# Patient Record
Sex: Male | Born: 1950 | Race: White | Hispanic: No | Marital: Single | Smoking: Former smoker
Health system: Southern US, Community
[De-identification: ages and names within clinical notes are randomized; demographics above are authoritative.]

## PROBLEM LIST (undated history)

## (undated) DIAGNOSIS — R220 Localized swelling, mass and lump, head: Secondary | ICD-10-CM

## (undated) DIAGNOSIS — R1313 Dysphagia, pharyngeal phase: Secondary | ICD-10-CM

## (undated) DIAGNOSIS — F329 Major depressive disorder, single episode, unspecified: Secondary | ICD-10-CM

## (undated) DIAGNOSIS — Z931 Gastrostomy status: Secondary | ICD-10-CM

## (undated) DIAGNOSIS — H9190 Unspecified hearing loss, unspecified ear: Secondary | ICD-10-CM

## (undated) DIAGNOSIS — C029 Malignant neoplasm of tongue, unspecified: Secondary | ICD-10-CM

## (undated) DIAGNOSIS — Z8619 Personal history of other infectious and parasitic diseases: Secondary | ICD-10-CM

## (undated) DIAGNOSIS — I10 Essential (primary) hypertension: Secondary | ICD-10-CM

## (undated) DIAGNOSIS — F419 Anxiety disorder, unspecified: Secondary | ICD-10-CM

## (undated) DIAGNOSIS — H6980 Other specified disorders of Eustachian tube, unspecified ear: Secondary | ICD-10-CM

## (undated) DIAGNOSIS — J449 Chronic obstructive pulmonary disease, unspecified: Secondary | ICD-10-CM

## (undated) DIAGNOSIS — Z87891 Personal history of nicotine dependence: Secondary | ICD-10-CM

## (undated) DIAGNOSIS — E781 Pure hyperglyceridemia: Secondary | ICD-10-CM

## (undated) DIAGNOSIS — I639 Cerebral infarction, unspecified: Secondary | ICD-10-CM

## (undated) DIAGNOSIS — E039 Hypothyroidism, unspecified: Secondary | ICD-10-CM

## (undated) DIAGNOSIS — F32A Depression, unspecified: Secondary | ICD-10-CM

## (undated) DIAGNOSIS — E119 Type 2 diabetes mellitus without complications: Secondary | ICD-10-CM

## (undated) DIAGNOSIS — J302 Other seasonal allergic rhinitis: Secondary | ICD-10-CM

## (undated) HISTORY — DX: Malignant neoplasm of tongue, unspecified: C02.9

## (undated) HISTORY — DX: Pure hyperglyceridemia: E78.1

## (undated) HISTORY — DX: Cerebral infarction, unspecified: I63.9

## (undated) HISTORY — DX: Other seasonal allergic rhinitis: J30.2

## (undated) HISTORY — DX: Hypothyroidism, unspecified: E03.9

## (undated) HISTORY — DX: Depression, unspecified: F32.A

## (undated) HISTORY — DX: Chronic obstructive pulmonary disease, unspecified: J44.9

## (undated) HISTORY — DX: Type 2 diabetes mellitus without complications: E11.9

## (undated) HISTORY — DX: Other specified disorders of Eustachian tube, unspecified ear: H69.80

## (undated) HISTORY — DX: Personal history of other infectious and parasitic diseases: Z86.19

## (undated) HISTORY — DX: Essential (primary) hypertension: I10

## (undated) HISTORY — DX: Gastrostomy status: Z93.1

## (undated) HISTORY — DX: Major depressive disorder, single episode, unspecified: F32.9

## (undated) HISTORY — DX: Anxiety disorder, unspecified: F41.9

## (undated) HISTORY — DX: Personal history of nicotine dependence: Z87.891

## (undated) HISTORY — DX: Unspecified hearing loss, unspecified ear: H91.90

## (undated) HISTORY — DX: Dysphagia, pharyngeal phase: R13.13

## (undated) HISTORY — DX: Localized swelling, mass and lump, head: R22.0

---

## 1994-08-14 DIAGNOSIS — E119 Type 2 diabetes mellitus without complications: Secondary | ICD-10-CM

## 1994-08-14 HISTORY — DX: Type 2 diabetes mellitus without complications: E11.9

## 2003-02-12 ENCOUNTER — Encounter: Payer: Self-pay | Admitting: Emergency Medicine

## 2003-02-12 ENCOUNTER — Inpatient Hospital Stay (HOSPITAL_COMMUNITY): Admission: EM | Admit: 2003-02-12 | Discharge: 2003-02-21 | Payer: Self-pay | Admitting: Emergency Medicine

## 2004-07-06 ENCOUNTER — Ambulatory Visit: Payer: Self-pay | Admitting: Internal Medicine

## 2004-07-22 ENCOUNTER — Ambulatory Visit: Payer: Self-pay | Admitting: Internal Medicine

## 2004-08-01 ENCOUNTER — Ambulatory Visit: Payer: Self-pay | Admitting: Internal Medicine

## 2004-08-22 ENCOUNTER — Ambulatory Visit: Payer: Self-pay | Admitting: Internal Medicine

## 2004-09-07 ENCOUNTER — Ambulatory Visit: Payer: Self-pay | Admitting: Internal Medicine

## 2004-09-16 ENCOUNTER — Ambulatory Visit: Payer: Self-pay | Admitting: Internal Medicine

## 2004-10-07 ENCOUNTER — Ambulatory Visit: Payer: Self-pay | Admitting: Internal Medicine

## 2004-10-10 ENCOUNTER — Ambulatory Visit: Payer: Self-pay | Admitting: Internal Medicine

## 2004-10-12 ENCOUNTER — Ambulatory Visit: Payer: Self-pay | Admitting: Internal Medicine

## 2004-10-28 ENCOUNTER — Ambulatory Visit: Payer: Self-pay | Admitting: Internal Medicine

## 2004-12-26 ENCOUNTER — Ambulatory Visit: Payer: Self-pay | Admitting: Internal Medicine

## 2005-01-06 ENCOUNTER — Ambulatory Visit: Payer: Self-pay | Admitting: Internal Medicine

## 2005-02-27 ENCOUNTER — Ambulatory Visit: Payer: Self-pay | Admitting: Internal Medicine

## 2005-05-19 ENCOUNTER — Ambulatory Visit: Payer: Self-pay | Admitting: Internal Medicine

## 2005-06-02 ENCOUNTER — Ambulatory Visit: Payer: Self-pay | Admitting: Internal Medicine

## 2005-08-18 ENCOUNTER — Ambulatory Visit: Payer: Self-pay | Admitting: Internal Medicine

## 2006-08-14 HISTORY — PX: TONSILLECTOMY: SUR1361

## 2006-09-03 ENCOUNTER — Ambulatory Visit: Payer: Self-pay | Admitting: Internal Medicine

## 2006-09-03 LAB — CONVERTED CEMR LAB
ALT: 26 units/L (ref 0–40)
AST: 22 units/L (ref 0–37)
BUN: 14 mg/dL (ref 6–23)
Calcium: 9.6 mg/dL (ref 8.4–10.5)
Chloride: 110 meq/L (ref 96–112)
Creatinine, Ser: 1.1 mg/dL (ref 0.4–1.5)
GFR calc Af Amer: 89 mL/min
GFR calc non Af Amer: 74 mL/min
Hgb A1c MFr Bld: 9.7 % — ABNORMAL HIGH (ref 4.6–6.0)
Potassium: 4 meq/L (ref 3.5–5.1)

## 2006-09-24 ENCOUNTER — Ambulatory Visit: Payer: Self-pay | Admitting: Internal Medicine

## 2006-10-31 ENCOUNTER — Ambulatory Visit: Payer: Self-pay | Admitting: Internal Medicine

## 2006-11-13 DIAGNOSIS — Z8719 Personal history of other diseases of the digestive system: Secondary | ICD-10-CM

## 2006-11-14 ENCOUNTER — Ambulatory Visit: Payer: Self-pay | Admitting: Internal Medicine

## 2006-11-22 ENCOUNTER — Ambulatory Visit: Payer: Self-pay | Admitting: Internal Medicine

## 2006-12-20 DIAGNOSIS — E1165 Type 2 diabetes mellitus with hyperglycemia: Secondary | ICD-10-CM

## 2006-12-20 DIAGNOSIS — E781 Pure hyperglyceridemia: Secondary | ICD-10-CM | POA: Insufficient documentation

## 2006-12-24 ENCOUNTER — Ambulatory Visit: Payer: Self-pay | Admitting: Internal Medicine

## 2006-12-24 LAB — CONVERTED CEMR LAB
ALT: 24 units/L (ref 0–40)
Calcium: 9.2 mg/dL (ref 8.4–10.5)
Chloride: 105 meq/L (ref 96–112)
Creatinine, Ser: 0.9 mg/dL (ref 0.4–1.5)
Creatinine,U: 194.6 mg/dL
Eosinophils Absolute: 0.2 10*3/uL (ref 0.0–0.6)
Eosinophils Relative: 1.8 % (ref 0.0–5.0)
GFR calc non Af Amer: 93 mL/min
Glucose, Bld: 151 mg/dL — ABNORMAL HIGH (ref 70–99)
HCT: 46.9 % (ref 39.0–52.0)
Hgb A1c MFr Bld: 9.4 % — ABNORMAL HIGH (ref 4.6–6.0)
MCV: 89.7 fL (ref 78.0–100.0)
Microalb Creat Ratio: 11.8 mg/g (ref 0.0–30.0)
Neutrophils Relative %: 67.7 % (ref 43.0–77.0)
Platelets: 303 10*3/uL (ref 150–400)
RBC: 5.23 M/uL (ref 4.22–5.81)
RDW: 12.4 % (ref 11.5–14.6)
Sodium: 141 meq/L (ref 135–145)
WBC: 13.7 10*3/uL — ABNORMAL HIGH (ref 4.5–10.5)

## 2007-01-08 ENCOUNTER — Encounter: Payer: Self-pay | Admitting: Internal Medicine

## 2007-01-08 ENCOUNTER — Ambulatory Visit: Payer: Self-pay | Admitting: Internal Medicine

## 2007-01-09 ENCOUNTER — Encounter: Payer: Self-pay | Admitting: Internal Medicine

## 2007-04-01 ENCOUNTER — Ambulatory Visit: Payer: Self-pay | Admitting: Internal Medicine

## 2007-04-03 LAB — CONVERTED CEMR LAB
ALT: 29 units/L (ref 0–53)
AST: 19 units/L (ref 0–37)
Basophils Absolute: 0 10*3/uL (ref 0.0–0.1)
Creatinine, Ser: 0.8 mg/dL (ref 0.4–1.5)
Eosinophils Absolute: 0.3 10*3/uL (ref 0.0–0.6)
MCHC: 35.1 g/dL (ref 30.0–36.0)
MCV: 92.7 fL (ref 78.0–100.0)
Platelets: 290 10*3/uL (ref 150–400)
RBC: 5.38 M/uL (ref 4.22–5.81)
RDW: 12.9 % (ref 11.5–14.6)
WBC: 12.3 10*3/uL — ABNORMAL HIGH (ref 4.5–10.5)

## 2007-04-08 ENCOUNTER — Encounter: Admission: RE | Admit: 2007-04-08 | Discharge: 2007-04-08 | Payer: Self-pay | Admitting: Otolaryngology

## 2007-04-10 ENCOUNTER — Other Ambulatory Visit: Admission: RE | Admit: 2007-04-10 | Discharge: 2007-04-10 | Payer: Self-pay | Admitting: Otolaryngology

## 2007-05-08 ENCOUNTER — Encounter: Payer: Self-pay | Admitting: Internal Medicine

## 2007-05-22 ENCOUNTER — Encounter: Payer: Self-pay | Admitting: Internal Medicine

## 2007-05-22 DIAGNOSIS — C099 Malignant neoplasm of tonsil, unspecified: Secondary | ICD-10-CM | POA: Insufficient documentation

## 2007-05-23 ENCOUNTER — Encounter: Payer: Self-pay | Admitting: Internal Medicine

## 2007-06-20 ENCOUNTER — Ambulatory Visit: Payer: Self-pay | Admitting: Internal Medicine

## 2007-06-21 ENCOUNTER — Ambulatory Visit: Admission: RE | Admit: 2007-06-21 | Discharge: 2007-08-14 | Payer: Self-pay | Admitting: Radiation Oncology

## 2007-06-24 ENCOUNTER — Encounter: Payer: Self-pay | Admitting: Internal Medicine

## 2007-06-24 LAB — COMPREHENSIVE METABOLIC PANEL
Albumin: 4.7 g/dL (ref 3.5–5.2)
CO2: 19 mEq/L (ref 19–32)
Glucose, Bld: 244 mg/dL — ABNORMAL HIGH (ref 70–99)
Potassium: 4.1 mEq/L (ref 3.5–5.3)
Sodium: 137 mEq/L (ref 135–145)
Total Bilirubin: 0.7 mg/dL (ref 0.3–1.2)
Total Protein: 6.9 g/dL (ref 6.0–8.3)

## 2007-06-24 LAB — CBC WITH DIFFERENTIAL/PLATELET
BASO%: 0.7 % (ref 0.0–2.0)
EOS%: 1.4 % (ref 0.0–7.0)
Eosinophils Absolute: 0.1 10*3/uL (ref 0.0–0.5)
LYMPH%: 22.4 % (ref 14.0–48.0)
MCHC: 36 g/dL — ABNORMAL HIGH (ref 32.0–35.9)
MCV: 90 fL (ref 81.6–98.0)
MONO%: 6.6 % (ref 0.0–13.0)
NEUT#: 5.9 10*3/uL (ref 1.5–6.5)
Platelets: 251 10*3/uL (ref 145–400)
RBC: 4.97 10*6/uL (ref 4.20–5.71)
RDW: 13 % (ref 11.2–14.6)

## 2007-06-24 LAB — LACTATE DEHYDROGENASE: LDH: 151 U/L (ref 94–250)

## 2007-06-26 ENCOUNTER — Encounter: Admission: RE | Admit: 2007-06-26 | Discharge: 2007-06-26 | Payer: Self-pay | Admitting: Dentistry

## 2007-06-26 ENCOUNTER — Ambulatory Visit: Payer: Self-pay | Admitting: Dentistry

## 2007-07-15 ENCOUNTER — Telehealth (INDEPENDENT_AMBULATORY_CARE_PROVIDER_SITE_OTHER): Payer: Self-pay | Admitting: *Deleted

## 2007-07-17 ENCOUNTER — Ambulatory Visit: Payer: Self-pay | Admitting: Internal Medicine

## 2007-07-17 DIAGNOSIS — I10 Essential (primary) hypertension: Secondary | ICD-10-CM | POA: Insufficient documentation

## 2007-07-17 DIAGNOSIS — Z87891 Personal history of nicotine dependence: Secondary | ICD-10-CM

## 2007-07-17 DIAGNOSIS — F329 Major depressive disorder, single episode, unspecified: Secondary | ICD-10-CM

## 2007-07-17 DIAGNOSIS — F419 Anxiety disorder, unspecified: Secondary | ICD-10-CM | POA: Insufficient documentation

## 2007-07-22 ENCOUNTER — Ambulatory Visit: Payer: Self-pay | Admitting: Dentistry

## 2007-07-24 LAB — CONVERTED CEMR LAB
GFR calc Af Amer: 99 mL/min
GFR calc non Af Amer: 82 mL/min
Glucose, Bld: 209 mg/dL — ABNORMAL HIGH (ref 70–99)
Potassium: 4.4 meq/L (ref 3.5–5.1)
Sodium: 140 meq/L (ref 135–145)

## 2007-08-01 ENCOUNTER — Ambulatory Visit: Payer: Self-pay | Admitting: Internal Medicine

## 2007-08-05 LAB — CBC WITH DIFFERENTIAL/PLATELET
BASO%: 0.5 % (ref 0.0–2.0)
MCHC: 36 g/dL — ABNORMAL HIGH (ref 32.0–35.9)
MONO#: 0.9 10*3/uL (ref 0.1–0.9)
RBC: 5.01 10*6/uL (ref 4.20–5.71)
WBC: 11.8 10*3/uL — ABNORMAL HIGH (ref 4.0–10.0)
lymph#: 2.9 10*3/uL (ref 0.9–3.3)

## 2007-08-05 LAB — MAGNESIUM: Magnesium: 1.8 mg/dL (ref 1.5–2.5)

## 2007-08-05 LAB — COMPREHENSIVE METABOLIC PANEL
ALT: 23 U/L (ref 0–53)
CO2: 18 mEq/L — ABNORMAL LOW (ref 19–32)
Calcium: 10 mg/dL (ref 8.4–10.5)
Chloride: 102 mEq/L (ref 96–112)
Sodium: 135 mEq/L (ref 135–145)
Total Protein: 7.5 g/dL (ref 6.0–8.3)

## 2007-08-13 LAB — CBC WITH DIFFERENTIAL/PLATELET
BASO%: 0.6 % (ref 0.0–2.0)
Eosinophils Absolute: 0.2 10*3/uL (ref 0.0–0.5)
LYMPH%: 17.3 % (ref 14.0–48.0)
MCHC: 36.7 g/dL — ABNORMAL HIGH (ref 32.0–35.9)
MONO#: 1.2 10*3/uL — ABNORMAL HIGH (ref 0.1–0.9)
MONO%: 7.9 % (ref 0.0–13.0)
NEUT#: 11.2 10*3/uL — ABNORMAL HIGH (ref 1.5–6.5)
Platelets: 287 10*3/uL (ref 145–400)
RBC: 4.85 10*6/uL (ref 4.20–5.71)
RDW: 10.2 % — ABNORMAL LOW (ref 11.2–14.6)
WBC: 15.3 10*3/uL — ABNORMAL HIGH (ref 4.0–10.0)

## 2007-08-13 LAB — COMPREHENSIVE METABOLIC PANEL
ALT: 29 U/L (ref 0–53)
CO2: 22 mEq/L (ref 19–32)
Creatinine, Ser: 0.78 mg/dL (ref 0.40–1.50)
Total Bilirubin: 0.8 mg/dL (ref 0.3–1.2)

## 2007-08-15 ENCOUNTER — Ambulatory Visit: Admission: RE | Admit: 2007-08-15 | Discharge: 2007-11-13 | Payer: Self-pay | Admitting: Radiation Oncology

## 2007-09-03 LAB — COMPREHENSIVE METABOLIC PANEL
ALT: 29 U/L (ref 0–53)
Albumin: 4.6 g/dL (ref 3.5–5.2)
CO2: 27 mEq/L (ref 19–32)
Calcium: 9.2 mg/dL (ref 8.4–10.5)
Chloride: 101 mEq/L (ref 96–112)
Glucose, Bld: 243 mg/dL — ABNORMAL HIGH (ref 70–99)
Sodium: 139 mEq/L (ref 135–145)
Total Protein: 7.1 g/dL (ref 6.0–8.3)

## 2007-09-03 LAB — CBC WITH DIFFERENTIAL/PLATELET
BASO%: 0.3 % (ref 0.0–2.0)
Eosinophils Absolute: 0.1 10*3/uL (ref 0.0–0.5)
HCT: 42.2 % (ref 38.7–49.9)
MCHC: 36.5 g/dL — ABNORMAL HIGH (ref 32.0–35.9)
MONO#: 0.5 10*3/uL (ref 0.1–0.9)
NEUT#: 5 10*3/uL (ref 1.5–6.5)
RBC: 4.7 10*6/uL (ref 4.20–5.71)
WBC: 6.2 10*3/uL (ref 4.0–10.0)
lymph#: 0.5 10*3/uL — ABNORMAL LOW (ref 0.9–3.3)

## 2007-09-03 LAB — MAGNESIUM: Magnesium: 1.9 mg/dL (ref 1.5–2.5)

## 2007-09-05 ENCOUNTER — Ambulatory Visit: Payer: Self-pay | Admitting: Dentistry

## 2007-09-10 LAB — CBC WITH DIFFERENTIAL/PLATELET
BASO%: 0.3 % (ref 0.0–2.0)
EOS%: 0.7 % (ref 0.0–7.0)
HCT: 41.8 % (ref 38.7–49.9)
MCH: 32.6 pg (ref 28.0–33.4)
MCHC: 36.4 g/dL — ABNORMAL HIGH (ref 32.0–35.9)
NEUT%: 89.2 % — ABNORMAL HIGH (ref 40.0–75.0)
RBC: 4.66 10*6/uL (ref 4.20–5.71)
RDW: 13.8 % (ref 11.2–14.6)
lymph#: 0.4 10*3/uL — ABNORMAL LOW (ref 0.9–3.3)

## 2007-09-13 ENCOUNTER — Ambulatory Visit: Payer: Self-pay | Admitting: Internal Medicine

## 2007-09-23 ENCOUNTER — Ambulatory Visit (HOSPITAL_COMMUNITY): Admission: RE | Admit: 2007-09-23 | Discharge: 2007-09-23 | Payer: Self-pay | Admitting: Radiation Oncology

## 2007-09-24 LAB — COMPREHENSIVE METABOLIC PANEL
AST: 15 U/L (ref 0–37)
Albumin: 4.2 g/dL (ref 3.5–5.2)
Alkaline Phosphatase: 77 U/L (ref 39–117)
BUN: 31 mg/dL — ABNORMAL HIGH (ref 6–23)
Calcium: 8.8 mg/dL (ref 8.4–10.5)
Chloride: 106 mEq/L (ref 96–112)
Glucose, Bld: 181 mg/dL — ABNORMAL HIGH (ref 70–99)
Potassium: 4.1 mEq/L (ref 3.5–5.3)
Sodium: 141 mEq/L (ref 135–145)
Total Protein: 6.2 g/dL (ref 6.0–8.3)

## 2007-09-24 LAB — CBC WITH DIFFERENTIAL/PLATELET
Basophils Absolute: 0 10*3/uL (ref 0.0–0.1)
Eosinophils Absolute: 0.1 10*3/uL (ref 0.0–0.5)
HGB: 13.9 g/dL (ref 13.0–17.1)
NEUT#: 7.4 10*3/uL — ABNORMAL HIGH (ref 1.5–6.5)
RBC: 4.3 10*6/uL (ref 4.20–5.71)
RDW: 14.3 % (ref 11.2–14.6)
WBC: 8.4 10*3/uL (ref 4.0–10.0)
lymph#: 0.2 10*3/uL — ABNORMAL LOW (ref 0.9–3.3)

## 2007-11-15 ENCOUNTER — Ambulatory Visit (HOSPITAL_COMMUNITY): Admission: RE | Admit: 2007-11-15 | Discharge: 2007-11-15 | Payer: Self-pay | Admitting: Internal Medicine

## 2007-11-18 ENCOUNTER — Ambulatory Visit: Payer: Self-pay | Admitting: Internal Medicine

## 2007-11-19 ENCOUNTER — Ambulatory Visit: Payer: Self-pay | Admitting: Dentistry

## 2007-11-20 ENCOUNTER — Encounter: Payer: Self-pay | Admitting: Internal Medicine

## 2007-11-20 LAB — COMPREHENSIVE METABOLIC PANEL
Albumin: 4.2 g/dL (ref 3.5–5.2)
BUN: 27 mg/dL — ABNORMAL HIGH (ref 6–23)
CO2: 23 mEq/L (ref 19–32)
Calcium: 9 mg/dL (ref 8.4–10.5)
Chloride: 105 mEq/L (ref 96–112)
Glucose, Bld: 173 mg/dL — ABNORMAL HIGH (ref 70–99)
Potassium: 3.6 mEq/L (ref 3.5–5.3)
Sodium: 143 mEq/L (ref 135–145)
Total Protein: 6.5 g/dL (ref 6.0–8.3)

## 2007-11-20 LAB — CBC WITH DIFFERENTIAL/PLATELET
Basophils Absolute: 0 10*3/uL (ref 0.0–0.1)
EOS%: 2.2 % (ref 0.0–7.0)
Eosinophils Absolute: 0.2 10*3/uL (ref 0.0–0.5)
HCT: 28.6 % — ABNORMAL LOW (ref 38.7–49.9)
HGB: 10.4 g/dL — ABNORMAL LOW (ref 13.0–17.1)
MCH: 35.4 pg — ABNORMAL HIGH (ref 28.0–33.4)
MONO#: 0.5 10*3/uL (ref 0.1–0.9)
NEUT#: 6.7 10*3/uL — ABNORMAL HIGH (ref 1.5–6.5)
NEUT%: 85 % — ABNORMAL HIGH (ref 40.0–75.0)
RDW: 17.5 % — ABNORMAL HIGH (ref 11.2–14.6)
WBC: 7.9 10*3/uL (ref 4.0–10.0)
lymph#: 0.4 10*3/uL — ABNORMAL LOW (ref 0.9–3.3)

## 2007-12-24 ENCOUNTER — Ambulatory Visit: Payer: Self-pay | Admitting: Internal Medicine

## 2007-12-30 LAB — CONVERTED CEMR LAB
ALT: 19 units/L (ref 0–53)
BUN: 24 mg/dL — ABNORMAL HIGH (ref 6–23)
Calcium: 9.1 mg/dL (ref 8.4–10.5)
Creatinine, Ser: 1 mg/dL (ref 0.4–1.5)
GFR calc Af Amer: 99 mL/min
Glucose, Bld: 251 mg/dL — ABNORMAL HIGH (ref 70–99)
Microalb Creat Ratio: 13.5 mg/g (ref 0.0–30.0)
Potassium: 3.8 meq/L (ref 3.5–5.1)

## 2008-01-22 ENCOUNTER — Ambulatory Visit: Payer: Self-pay | Admitting: Internal Medicine

## 2008-02-26 ENCOUNTER — Ambulatory Visit: Payer: Self-pay | Admitting: Internal Medicine

## 2008-03-02 LAB — CBC WITH DIFFERENTIAL/PLATELET
Basophils Absolute: 0 10*3/uL (ref 0.0–0.1)
EOS%: 1.7 % (ref 0.0–7.0)
Eosinophils Absolute: 0.1 10*3/uL (ref 0.0–0.5)
HCT: 38.8 % (ref 38.7–49.9)
HGB: 13.6 g/dL (ref 13.0–17.1)
MCH: 32.5 pg (ref 28.0–33.4)
NEUT#: 6.7 10*3/uL — ABNORMAL HIGH (ref 1.5–6.5)
NEUT%: 85.3 % — ABNORMAL HIGH (ref 40.0–75.0)
RDW: 13.2 % (ref 11.2–14.6)
lymph#: 0.5 10*3/uL — ABNORMAL LOW (ref 0.9–3.3)

## 2008-03-02 LAB — COMPREHENSIVE METABOLIC PANEL
Albumin: 4.1 g/dL (ref 3.5–5.2)
BUN: 16 mg/dL (ref 6–23)
CO2: 23 mEq/L (ref 19–32)
Calcium: 9.3 mg/dL (ref 8.4–10.5)
Chloride: 107 mEq/L (ref 96–112)
Creatinine, Ser: 1.28 mg/dL (ref 0.40–1.50)
Glucose, Bld: 124 mg/dL — ABNORMAL HIGH (ref 70–99)
Potassium: 4.2 mEq/L (ref 3.5–5.3)

## 2008-03-05 ENCOUNTER — Encounter: Payer: Self-pay | Admitting: Internal Medicine

## 2008-03-10 ENCOUNTER — Ambulatory Visit: Payer: Self-pay | Admitting: Dentistry

## 2008-03-10 ENCOUNTER — Ambulatory Visit (HOSPITAL_COMMUNITY): Admission: RE | Admit: 2008-03-10 | Discharge: 2008-03-10 | Payer: Self-pay | Admitting: Internal Medicine

## 2008-03-18 ENCOUNTER — Encounter: Payer: Self-pay | Admitting: Internal Medicine

## 2008-03-25 ENCOUNTER — Ambulatory Visit: Payer: Self-pay | Admitting: Internal Medicine

## 2008-04-01 ENCOUNTER — Ambulatory Visit: Payer: Self-pay | Admitting: Internal Medicine

## 2008-04-02 ENCOUNTER — Ambulatory Visit (HOSPITAL_COMMUNITY): Admission: RE | Admit: 2008-04-02 | Discharge: 2008-04-02 | Payer: Self-pay | Admitting: Internal Medicine

## 2008-04-07 ENCOUNTER — Telehealth (INDEPENDENT_AMBULATORY_CARE_PROVIDER_SITE_OTHER): Payer: Self-pay | Admitting: *Deleted

## 2008-04-07 LAB — CONVERTED CEMR LAB
Basophils Absolute: 0 10*3/uL (ref 0.0–0.1)
Basophils Relative: 0.1 % (ref 0.0–3.0)
Cholesterol: 169 mg/dL (ref 0–200)
Eosinophils Absolute: 0.1 10*3/uL (ref 0.0–0.7)
MCHC: 35.5 g/dL (ref 30.0–36.0)
MCV: 95.5 fL (ref 78.0–100.0)
Neutro Abs: 5.6 10*3/uL (ref 1.4–7.7)
Neutrophils Relative %: 80.8 % — ABNORMAL HIGH (ref 43.0–77.0)
RBC: 3.96 M/uL — ABNORMAL LOW (ref 4.22–5.81)
Triglycerides: 100 mg/dL (ref 0–149)
VLDL: 20 mg/dL (ref 0–40)

## 2008-04-14 ENCOUNTER — Encounter (INDEPENDENT_AMBULATORY_CARE_PROVIDER_SITE_OTHER): Payer: Self-pay | Admitting: *Deleted

## 2008-06-16 ENCOUNTER — Ambulatory Visit: Payer: Self-pay | Admitting: Internal Medicine

## 2008-06-18 LAB — COMPREHENSIVE METABOLIC PANEL
AST: 12 U/L (ref 0–37)
Albumin: 4.5 g/dL (ref 3.5–5.2)
Alkaline Phosphatase: 89 U/L (ref 39–117)
BUN: 13 mg/dL (ref 6–23)
Calcium: 9.4 mg/dL (ref 8.4–10.5)
Creatinine, Ser: 1.24 mg/dL (ref 0.40–1.50)
Glucose, Bld: 159 mg/dL — ABNORMAL HIGH (ref 70–99)

## 2008-06-18 LAB — CBC WITH DIFFERENTIAL/PLATELET
Basophils Absolute: 0 10*3/uL (ref 0.0–0.1)
EOS%: 2.3 % (ref 0.0–7.0)
Eosinophils Absolute: 0.2 10*3/uL (ref 0.0–0.5)
HCT: 42 % (ref 38.7–49.9)
HGB: 14.9 g/dL (ref 13.0–17.1)
MCH: 32.8 pg (ref 28.0–33.4)
MCV: 92.4 fL (ref 81.6–98.0)
MONO%: 7.1 % (ref 0.0–13.0)
NEUT#: 6.7 10*3/uL — ABNORMAL HIGH (ref 1.5–6.5)
NEUT%: 81.5 % — ABNORMAL HIGH (ref 40.0–75.0)
Platelets: 235 10*3/uL (ref 145–400)

## 2008-06-23 ENCOUNTER — Ambulatory Visit (HOSPITAL_COMMUNITY): Admission: RE | Admit: 2008-06-23 | Discharge: 2008-06-23 | Payer: Self-pay | Admitting: Internal Medicine

## 2008-06-24 ENCOUNTER — Encounter (INDEPENDENT_AMBULATORY_CARE_PROVIDER_SITE_OTHER): Payer: Self-pay | Admitting: *Deleted

## 2008-06-25 ENCOUNTER — Encounter: Payer: Self-pay | Admitting: Internal Medicine

## 2008-12-16 ENCOUNTER — Ambulatory Visit: Payer: Self-pay | Admitting: Internal Medicine

## 2008-12-18 ENCOUNTER — Ambulatory Visit (HOSPITAL_COMMUNITY): Admission: RE | Admit: 2008-12-18 | Discharge: 2008-12-18 | Payer: Self-pay | Admitting: Internal Medicine

## 2008-12-18 LAB — COMPREHENSIVE METABOLIC PANEL
AST: 22 U/L (ref 0–37)
Alkaline Phosphatase: 100 U/L (ref 39–117)
BUN: 13 mg/dL (ref 6–23)
Creatinine, Ser: 1.23 mg/dL (ref 0.40–1.50)
Potassium: 4 mEq/L (ref 3.5–5.3)
Total Bilirubin: 1.1 mg/dL (ref 0.3–1.2)

## 2008-12-18 LAB — CBC WITH DIFFERENTIAL/PLATELET
Basophils Absolute: 0 10*3/uL (ref 0.0–0.1)
EOS%: 1.9 % (ref 0.0–7.0)
HGB: 15.5 g/dL (ref 13.0–17.1)
LYMPH%: 12.1 % — ABNORMAL LOW (ref 14.0–49.0)
MCH: 32.2 pg (ref 27.2–33.4)
MCV: 90.9 fL (ref 79.3–98.0)
MONO%: 5.8 % (ref 0.0–14.0)
NEUT%: 79.9 % — ABNORMAL HIGH (ref 39.0–75.0)
Platelets: 208 10*3/uL (ref 140–400)
RDW: 13.3 % (ref 11.0–14.6)

## 2008-12-23 ENCOUNTER — Encounter: Payer: Self-pay | Admitting: Internal Medicine

## 2009-02-19 IMAGING — XA IR PERC PLACEMENT GASTROSTOMY
1 series · 3 of 3 positions shown · non-contrast
Comparison: none

CLINICAL DATA: Tonsillar carcinoma, currently undergoing radiation therapy

Percutaneous gastrostomy tube placement under fluoroscopy:
TECHNIQUE: Oral barium had been administered preprocedure. A 5 French
angiographic catheter was placed as orogastric tube. The upper abdomen was
prepped with Betadine, draped in usual sterile fashion, and infiltrated locally
with 1% lidocaine. Intravenous fentanyl and Versed were administered as
conscious sedation during continuous cardiorespiratory monitoring by the
radiology RN. Stomach was insufflated using air through the orogastric tube. An
18 French sheath needle was advanced percutaneously into the gastric lumen under
fluoroscopy. Gas could be aspirated and a small contrast injection confirmed
intraluminal spread. The sheath was exchanged over a guidewire for a 9 French
vascular sheath, through which the snare device was advanced and used to snare a
guidewire passed through the orogastric tube. This was withdrawn, and the snare
attached to the 20 French pull-through gastrostomy tube, which was advanced
antegrade, positioned with the internal bumper securing the anterior gastric
wall to the anterior abdominal wall. Small contrast injection confirms
appropriate positioning. The external bumper was applied and the catheter was
flushed. No immediate complication.

[Series 1000: run · 0.22mm/px · 3 of 3 slices shown]
[im 1/3]
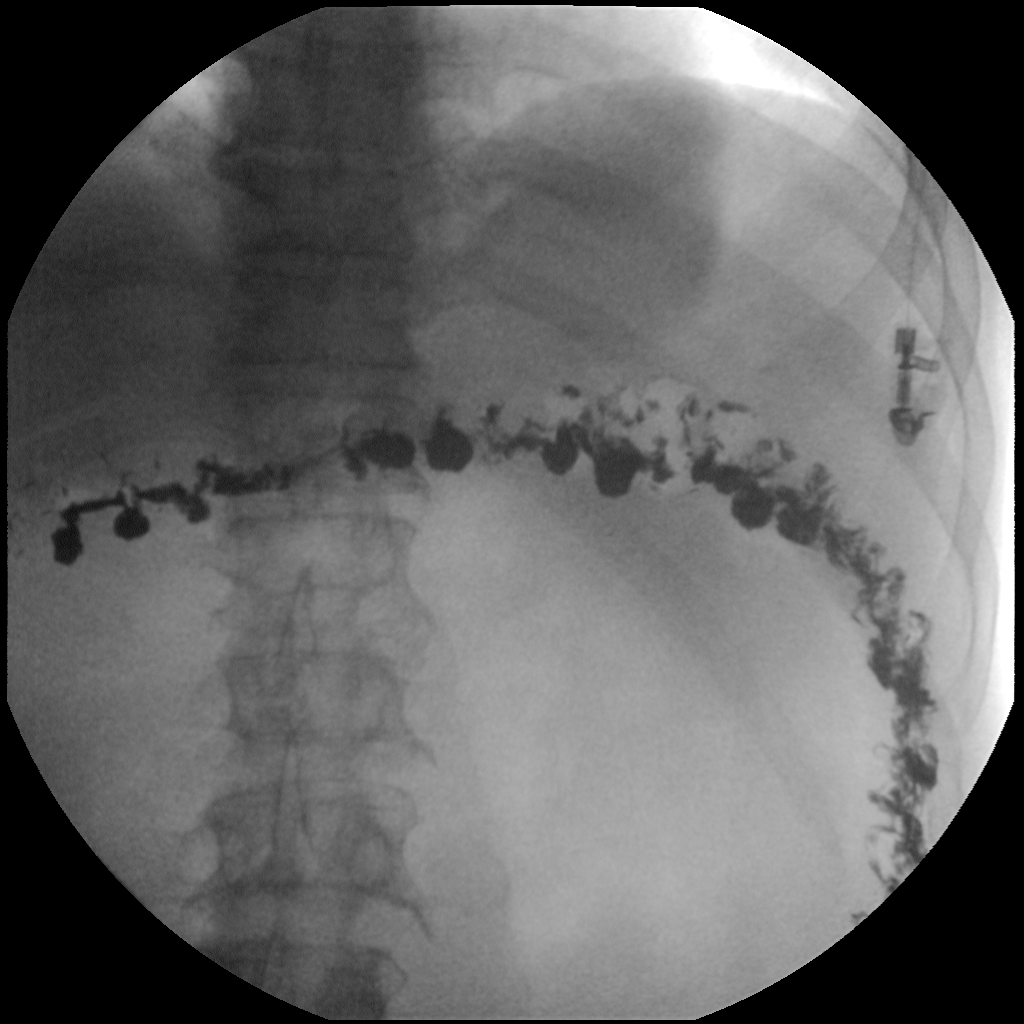
[im 2/3]
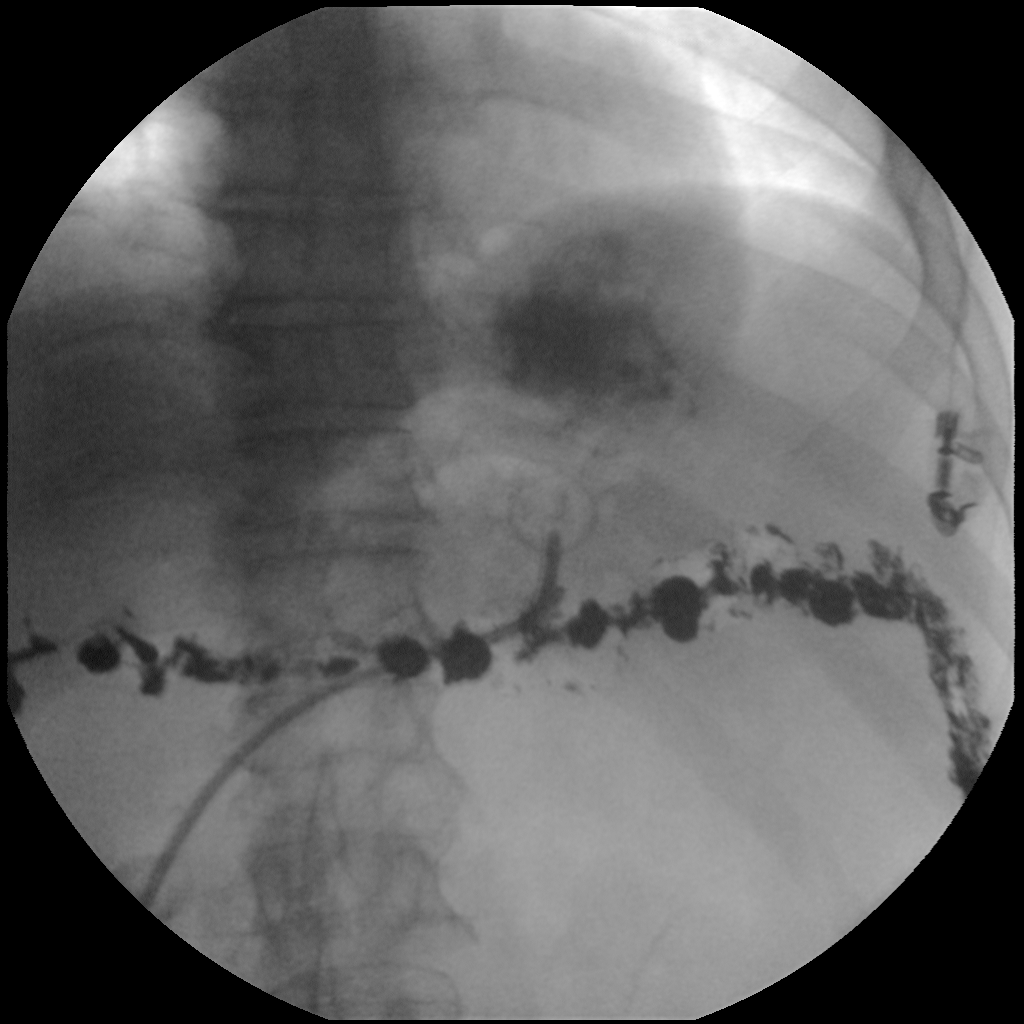
[im 3/3]
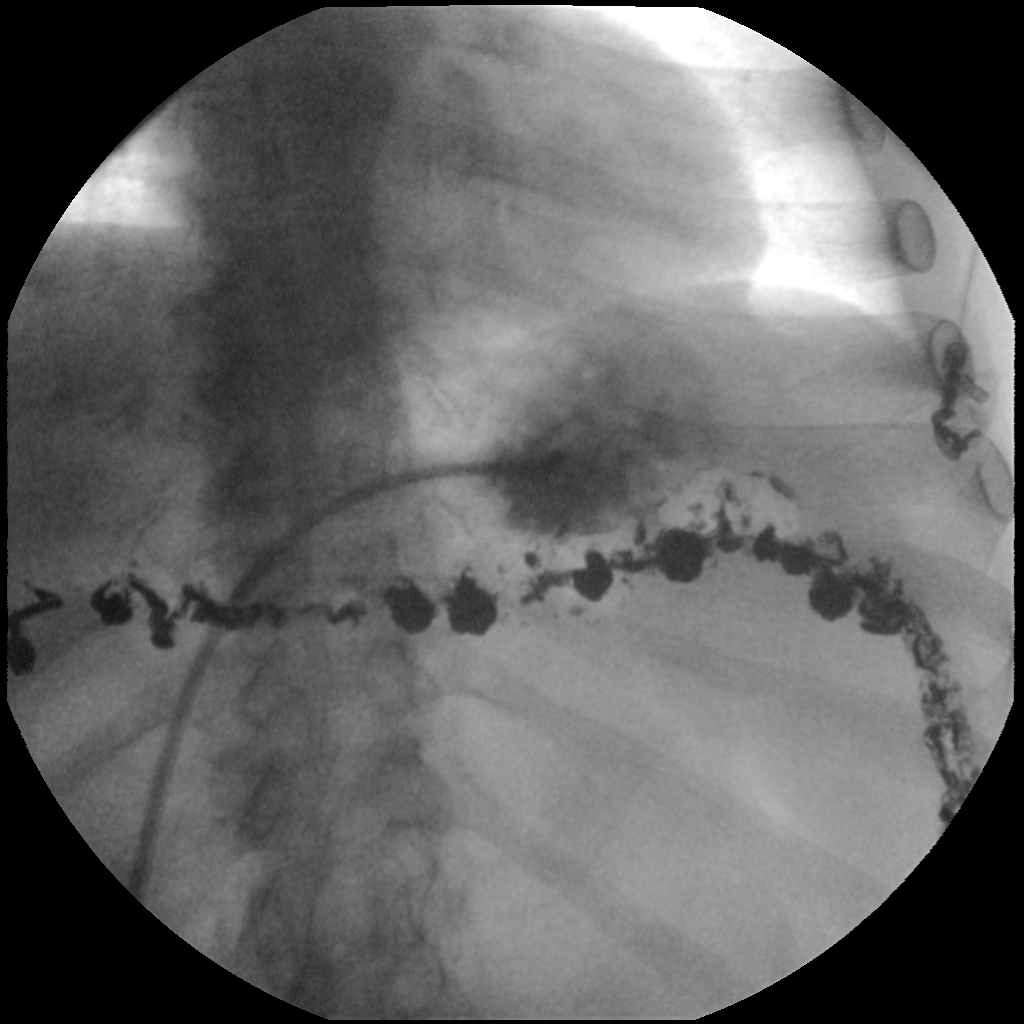

[3 of 3 positions shown; findings below may reference images not displayed]

IMPRESSION: 1. Technically successful 20 French pull-through gastrostomy placement under
fluoroscopy.

## 2009-04-13 IMAGING — CT CT NECK W/ CM
3 of 4 series · 15 of 33 positions shown, 18 images · IV contrast (agent unspecified)
Comparison: 04/08/2007.

CLINICAL DATA: 57-year-old male with tonsillar cancer diagnosed
May 2007 status post chemotherapy and radiation completion 1
month ago.  Additional history of posterior neck abscess.

CT NECK WITH CONTRAST
TECHNIQUE: Multidetector CT imaging of the neck was performed with
intravenous contrast.
Contrast: 100 ml Jmnipaque-GMM.

[Series 2: neck 3.0 b40s · axial · 0.45mm/px · z∈[+1177,+1429]mm · 7 of 106 slices shown, 9 images]
[im 11/106  soft-tissue]
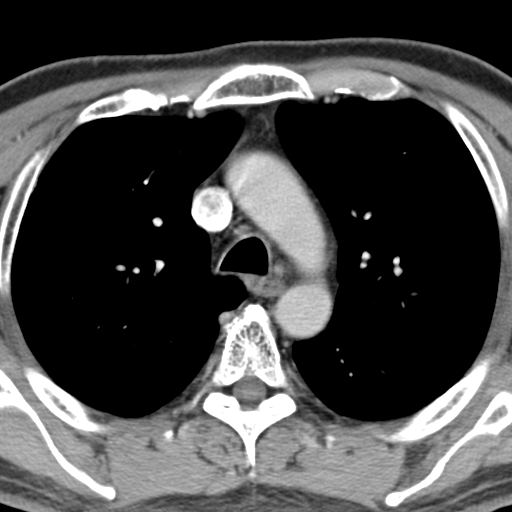
[im 11/106  bone]
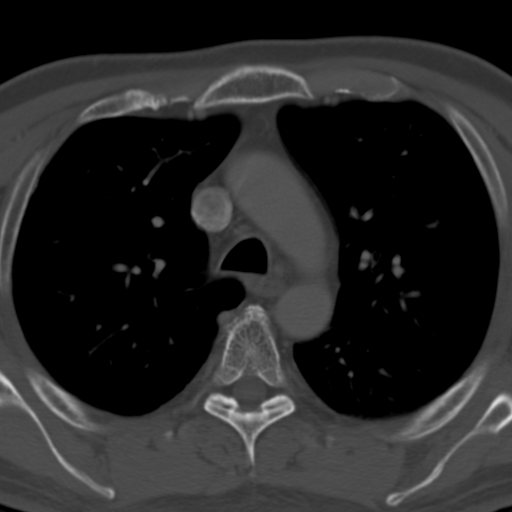
[im 22/106  bone]
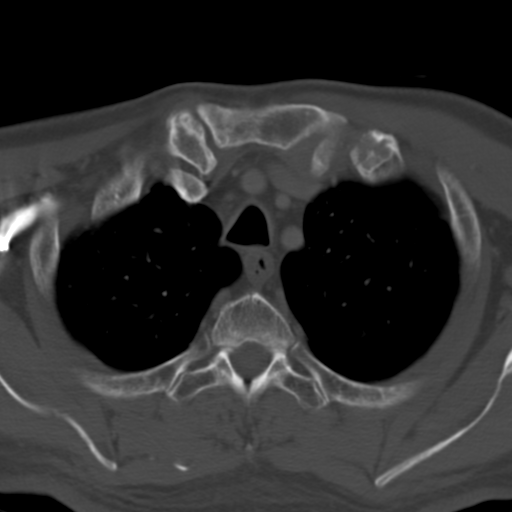
[im 43/106  bone]
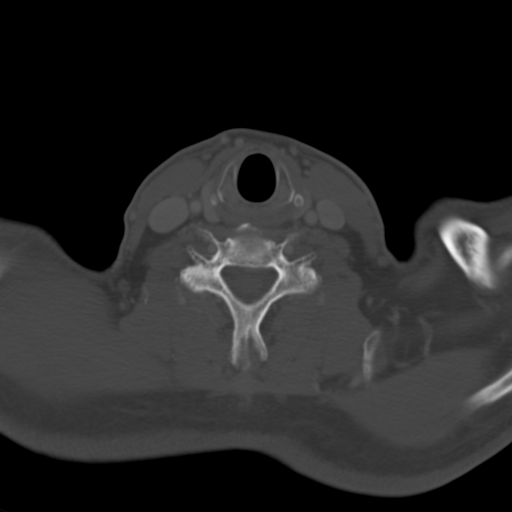
[im 53/106  bone]
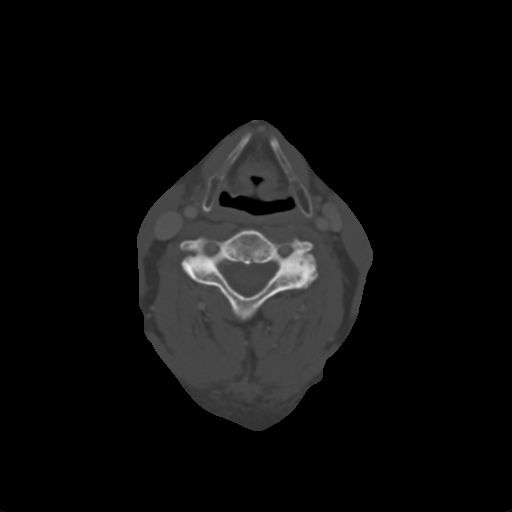
[im 64/106  soft-tissue]
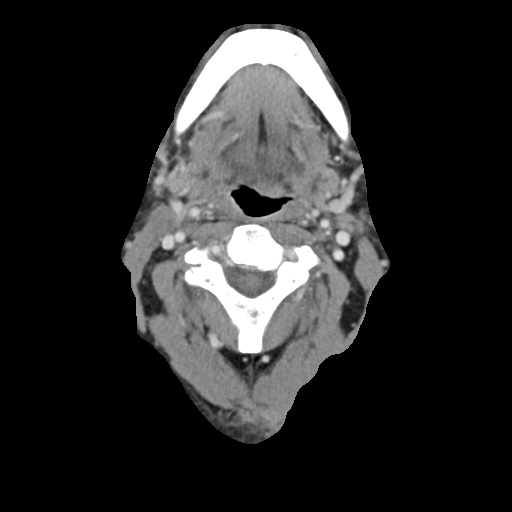
[im 64/106  bone]
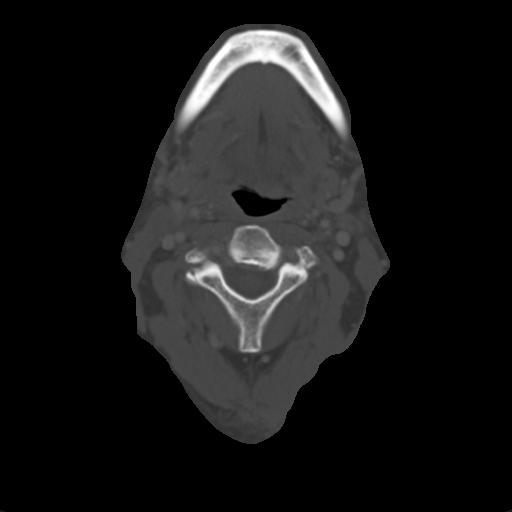
[im 85/106  bone]
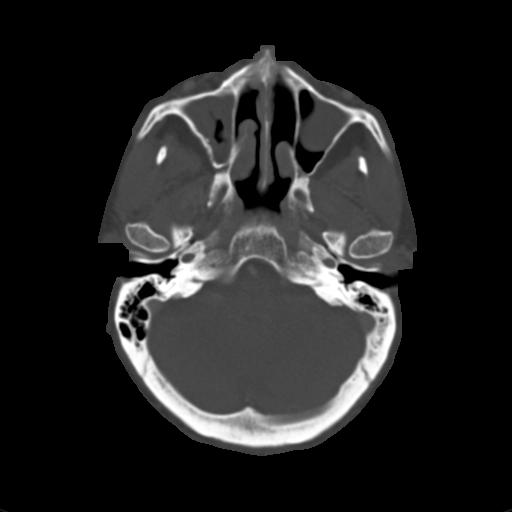
[im 95/106  bone]
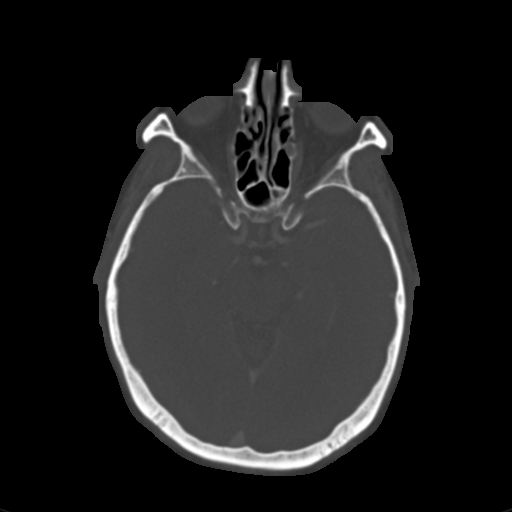

[Series 602: <mpr thick range> · coronal · 0.62mm/px · 3 of 68 slices shown]
[im 14/68  bone]
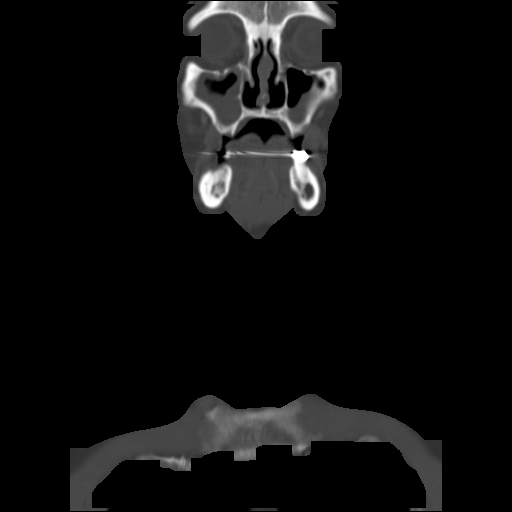
[im 27/68  bone]
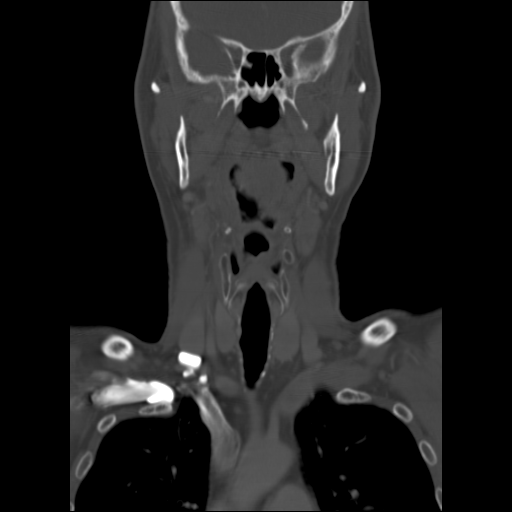
[im 41/68  bone]
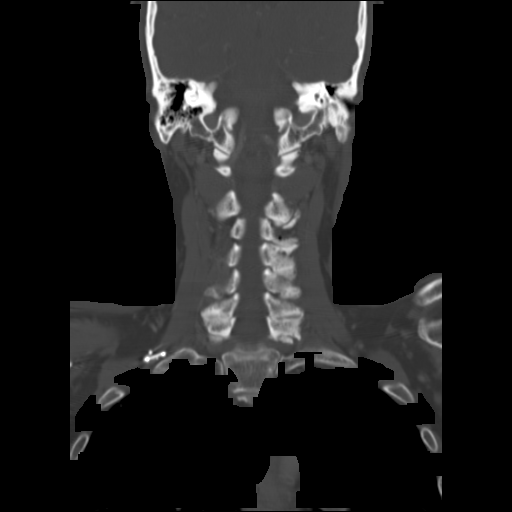

[Series 603: <mpr thick range(1)> · sagittal · 0.62mm/px · 5 of 54 slices shown, 6 images]
[im 18/54  bone]
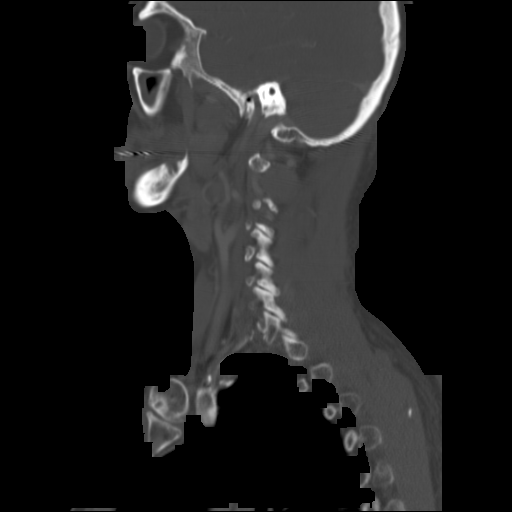
[im 23/54  bone]
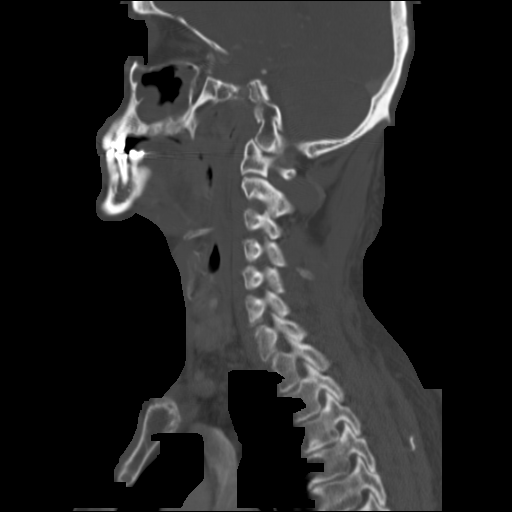
[im 27/54  soft-tissue]
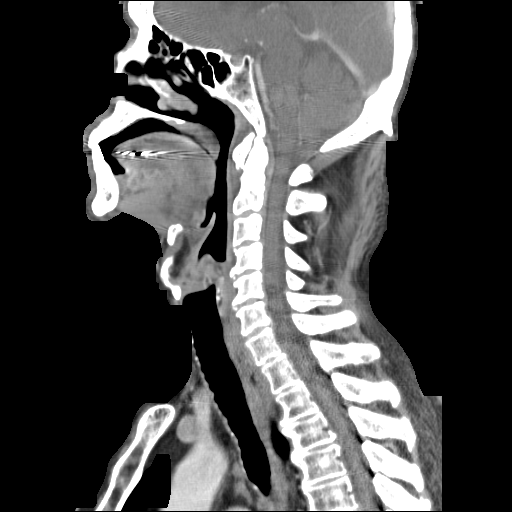
[im 27/54  bone]
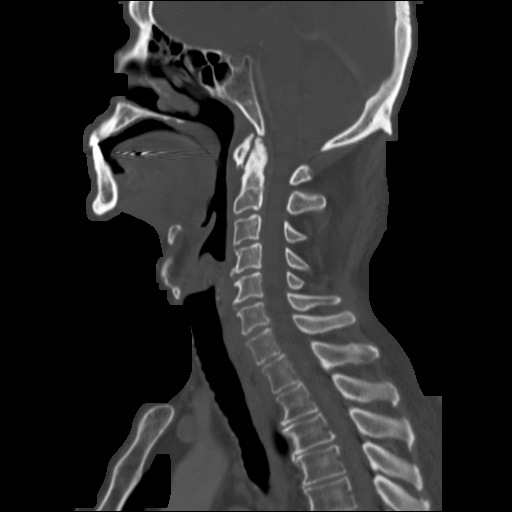
[im 31/54  bone]
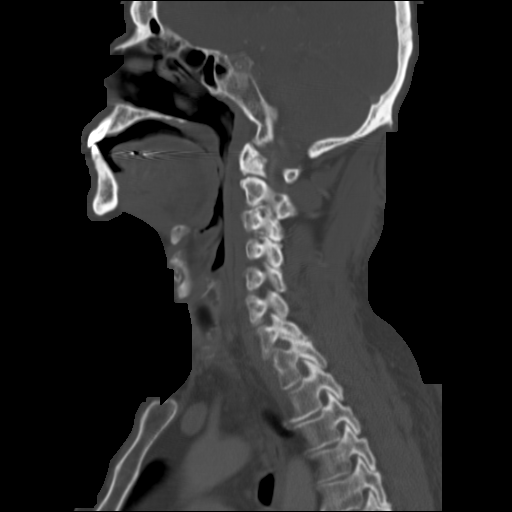
[im 36/54  bone]
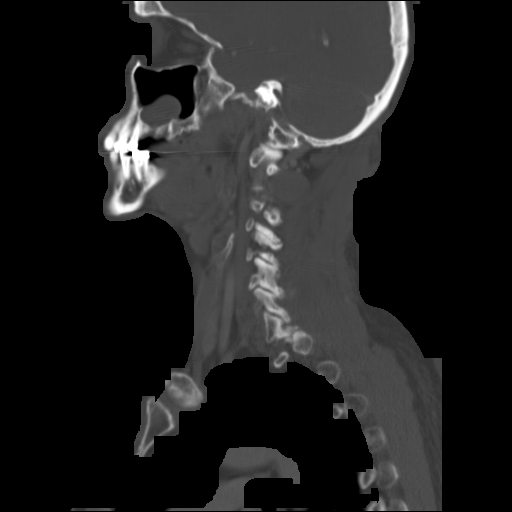

[15 of 33 positions shown; findings below may reference images not displayed]

FINDINGS: Dependent pulmonary atelectasis suspected with scattered
dependent ground-glass opacity.  Stable mild diffuse interstitial
opacity prominence in the upper lobes.  There is a tiny 1-2 mm
subpleural density in the right upper lobe laterally (series 4
image 23) which is increased in conspicuity since the prior exam.
At the inferior level of the study, there is ill-defined patchy
peribronchovascular opacity seen in the posterior aspect of the
right upper lobe.  This may reflect acute pulmonary infection.
Thickening of the left major fissure on series 4 image 19 is
unchanged.  The trachea is patent. Sub centimeter precarinal lymph
node is more completely visualized and not enlarged by CT criteria.

Cervical degenerative changes are again noted.  No suspicious
osseous lesion is seen.  Chronic paranasal sinus disease is re-
identified with maxillary predominance.  Orbits are normal.
Visualized brain parenchyma is within normal limits.  Major
vascular structures in the neck remain patent and within normal
limits.  The thyroid and larynx remain normal.

There is stable to increased thickening of the epiglottis which may
in part reflect sequelae of radiation.  The hypopharynx is
otherwise stable.  The right tongue base appears partially
atrophied since the prior exam, this may reflect the site of the
surgical resection or regression of tumor from this level.  There
is decreased bilateral tonsillar pillar hypertrophy.  The
nasopharynx is within normal limits.  No suspicious tonsillar mass
is identified, but the primary was also not evident on the prior
exam.  Previously seen abnormally enlarged right level II and III
lymph nodes have resolved.  There are stable small level I nodes.
A right level IIIb node has also decreased in size.  Currently, no
cervical lymph nodes are enlarged by CT criteria.  Left posterior
neck postoperative scarring is unchanged and may be related to the
abscess surgery described in the clinical data section.
IMPRESSION: 1.  Interval resolution of right level II and III cervical
lymphadenopathy.
2.  Decreased tonsillar hypertrophy, previously diffuse.  Soft
tissue deficiency at the right tongue base/lingual tonsil is new.
No discrete pharyngeal mucosal space lesion is identified, although
the primary was not evident on the previous study either.  PET-CT
may be helpful as clinically indicated.
3.  Ill-defined posterior right upper lobe peribronchovascular
pulmonary opacity may reflect acute pulmonary infection.  Clinical
correlation recommended and dedicated chest imaging may be helpful.

## 2009-06-23 ENCOUNTER — Ambulatory Visit: Payer: Self-pay | Admitting: Internal Medicine

## 2009-06-28 LAB — CBC WITH DIFFERENTIAL/PLATELET
Basophils Absolute: 0 10*3/uL (ref 0.0–0.1)
EOS%: 1.4 % (ref 0.0–7.0)
MCH: 32.4 pg (ref 27.2–33.4)
MCV: 94.6 fL (ref 79.3–98.0)
MONO%: 4.7 % (ref 0.0–14.0)
RBC: 4.43 10*6/uL (ref 4.20–5.82)
RDW: 13.9 % (ref 11.0–14.6)

## 2009-06-28 LAB — COMPREHENSIVE METABOLIC PANEL
AST: 22 U/L (ref 0–37)
Albumin: 3.8 g/dL (ref 3.5–5.2)
Alkaline Phosphatase: 81 U/L (ref 39–117)
BUN: 19 mg/dL (ref 6–23)
Potassium: 3.8 mEq/L (ref 3.5–5.3)
Sodium: 140 mEq/L (ref 135–145)

## 2009-06-29 ENCOUNTER — Ambulatory Visit (HOSPITAL_COMMUNITY): Admission: RE | Admit: 2009-06-29 | Discharge: 2009-06-29 | Payer: Self-pay | Admitting: Internal Medicine

## 2010-01-04 ENCOUNTER — Emergency Department (HOSPITAL_COMMUNITY): Admission: EM | Admit: 2010-01-04 | Discharge: 2010-01-04 | Payer: Self-pay | Admitting: Family Medicine

## 2010-03-02 ENCOUNTER — Encounter: Payer: Self-pay | Admitting: Internal Medicine

## 2010-03-08 ENCOUNTER — Encounter: Payer: Self-pay | Admitting: Internal Medicine

## 2010-03-15 ENCOUNTER — Encounter: Payer: Self-pay | Admitting: Internal Medicine

## 2010-03-22 ENCOUNTER — Encounter: Payer: Self-pay | Admitting: Internal Medicine

## 2010-03-29 ENCOUNTER — Encounter: Payer: Self-pay | Admitting: Internal Medicine

## 2010-04-05 ENCOUNTER — Encounter: Payer: Self-pay | Admitting: Internal Medicine

## 2010-04-12 ENCOUNTER — Encounter: Payer: Self-pay | Admitting: Internal Medicine

## 2010-04-19 ENCOUNTER — Encounter: Payer: Self-pay | Admitting: Internal Medicine

## 2010-04-26 ENCOUNTER — Encounter: Payer: Self-pay | Admitting: Internal Medicine

## 2010-05-03 ENCOUNTER — Encounter: Payer: Self-pay | Admitting: Internal Medicine

## 2010-05-10 ENCOUNTER — Encounter: Payer: Self-pay | Admitting: Internal Medicine

## 2010-06-07 ENCOUNTER — Encounter: Payer: Self-pay | Admitting: Internal Medicine

## 2010-07-27 ENCOUNTER — Encounter: Payer: Self-pay | Admitting: Internal Medicine

## 2010-08-23 ENCOUNTER — Encounter: Payer: Self-pay | Admitting: Internal Medicine

## 2010-09-04 ENCOUNTER — Encounter: Payer: Self-pay | Admitting: Internal Medicine

## 2010-09-05 ENCOUNTER — Encounter: Payer: Self-pay | Admitting: Internal Medicine

## 2010-09-13 NOTE — Letter (Signed)
Summary: [facility]  [facility]   Imported By: Lanelle Bal 03/24/2010 10:46:01  _____________________________________________________________________  External Attachment:    Type:   Image     Comment:   External Document

## 2010-09-13 NOTE — Letter (Signed)
Summary: cancer recurrence, planning XRT-chemo---Cancer Center  [facility]   Imported By: Lanelle Bal 03/23/2010 11:56:41  _____________________________________________________________________  External Attachment:    Type:   Image     Comment:   External Document

## 2010-09-13 NOTE — Letter (Signed)
Summary: [facility]  [facility]   Imported By: Lanelle Bal 04/22/2010 08:50:25  _____________________________________________________________________  External Attachment:    Type:   Image     Comment:   External Document

## 2010-09-13 NOTE — Letter (Signed)
Summary: [facility] follow up on XRT and chemotherapy  [facility]   Imported By: Lanelle Bal 03/31/2010 11:37:18  _____________________________________________________________________  External Attachment:    Type:   Image     Comment:   External Document

## 2010-09-13 NOTE — Letter (Signed)
Summary: [facility]  [facility]   Imported By: Lanelle Bal 06/02/2010 15:56:33  _____________________________________________________________________  External Attachment:    Type:   Image     Comment:   External Document

## 2010-09-13 NOTE — Letter (Signed)
Summary: doing relatively well---[facility]  [facility]   Imported By: Lanelle Bal 06/21/2010 11:31:51  _____________________________________________________________________  External Attachment:    Type:   Image     Comment:   External Document

## 2010-09-13 NOTE — Letter (Signed)
Summary: [facility]  [facility]   Imported By: Lanelle Bal 05/06/2010 14:13:11  _____________________________________________________________________  External Attachment:    Type:   Image     Comment:   External Document

## 2010-09-13 NOTE — Letter (Signed)
Summary: [facility]  [facility]   Imported By: Lanelle Bal 04/22/2010 08:57:06  _____________________________________________________________________  External Attachment:    Type:   Image     Comment:   External Document

## 2010-09-13 NOTE — Letter (Signed)
Summary: [facility]  [facility]   Imported By: Lanelle Bal 04/28/2010 10:41:31  _____________________________________________________________________  External Attachment:    Type:   Image     Comment:   External Document

## 2010-09-13 NOTE — Consult Note (Signed)
Summary: [facility]  [facility]   Imported By: Lanelle Bal 04/22/2010 08:51:18  _____________________________________________________________________  External Attachment:    Type:   Image     Comment:   External Document

## 2010-09-13 NOTE — Letter (Signed)
Summary: [facility]  [facility]   Imported By: Lanelle Bal 05/18/2010 08:15:41  _____________________________________________________________________  External Attachment:    Type:   Image     Comment:   External Document

## 2010-09-13 NOTE — Letter (Signed)
Summary: tolerating therapy okay--[facility]  [facility]   Imported By: Lanelle Bal 05/31/2010 14:23:18  _____________________________________________________________________  External Attachment:    Type:   Image     Comment:   External Document

## 2010-09-15 NOTE — Letter (Signed)
Summary: [facility]  [facility]   Imported By: Lanelle Bal 08/19/2010 10:39:45  _____________________________________________________________________  External Attachment:    Type:   Image     Comment:   External Document

## 2010-09-21 NOTE — Letter (Signed)
Summary: [facility]  [facility]   Imported By: Lanelle Bal 09/13/2010 08:38:06  _____________________________________________________________________  External Attachment:    Type:   Image     Comment:   External Document

## 2010-12-27 NOTE — Consult Note (Signed)
NAME:  CORRADO, HYMON NO.:  [id number]   MEDICAL RECORD NO.:  [id number]         PATIENT TYPE:  DOUT   LOCATION:                                FACILITY:  [facility]   PHYSICIAN:  Charlynne Pander, D.D.S.DATE OF BIRTH:  07/20/1951   DATE OF CONSULTATION:  06/26/2007  DATE OF DISCHARGE:                                 CONSULTATION   Jocob Dambach is a 60 year old male referred by Dr. Arbutus Ped for a pre-  chemoradiation therapy dental consultation.  The patient was recently  diagnosed with squamous cell carcinoma of the right tonsil.  Patient  with anticipated chemoradiation therapy with Drs. Mohamed and Kaufman  respectively.  The patient is now seen as part of a pre-chemoradiation  therapy dental protocol evaluation.   MEDICAL HISTORY:  1. 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      a.     Status post fine-needle aspirate of a right neck mass by Dr.       Brynda Peon on April 10, 2007 which showed multiple malignant       cells.      b.     Status post second opinion and subsequent right       tonsillectomy and panendoscopy by Dr. Willadean Carol on May 14, 2007.  Pathology was positive for squamous cell carcinoma of       the right tonsil.      c.     Anticipated chemotherapy with Dr. Arbutus Ped utilizing       cisplatinum weekly.      d.     Anticipated radiation therapy with Dr. Dorothy Puffer.  2. Hypertension.  3. Diabetes mellitus - type 2.  4. Hearing deficit on the right side.  5. Hypercholesterolemia.  6. Chronic sinusitis.   ALLERGIES/ADVERSE DRUG REACTION:  AUGMENTIN caused white  blotches/hives.   MEDICATIONS:  1. Lisinopril 20 mg daily.  2. Glimepiride 4 mg daily.  3. Glucophage XR 500 mg twice daily.  4. Gemfibrozil 600 mg twice daily.  5. Doxycycline 20 mg twice daily.  6. Bayer aspirin 81 mg daily.  7. Multiple vitamins to include Ginkgo biloba, Echinacea, potassium,      aloe vera, vitamin C,  vision formula, Citracal, vitamin B12, saw      palmetto, flexmin.   SOCIAL HISTORY:  The patient is divorced and has 2 children.  Patient  with a history of smoking two packs per day for 40 years.  The patient  now is smoking several cigarettes daily.  The patient denies use of  alcohol currently.   FAMILY HISTORY:  Mother died at the age of 54 secondary to heart  failure.  Father died at the age of 56 secondary to colon cancer.   FUNCTIONAL ASSESSMENT:  The patient remains independent for basic ADLs  at this time.   REVIEW OF SYSTEMS:  This is reviewed with the patient and is included in  the dental consultation record.   DENTAL  HISTORY:   CHIEF COMPLAINT:  The patient needs a pre-chemoradiation therapy dental  evaluation.   HISTORY OF PRESENT ILLNESS:  The patient was diagnosed with squamous  cell carcinoma of the right tonsil.  Patient with anticipated  chemoradiation therapy.  The patient now seen as part of a pre-  chemoradiation therapy dental protocol evaluation.   The patient currently denies acute toothache, swellings or abscesses.  The patient was last seen by a dentist in [place] approximately 8 years  ago.  The patient denies having any local dentist in [place].  The  patient does give a history of intermittent sensitivity to cold  involving the lower right quadrant.  The patient denies toothache  symptoms today.   PHYSICAL EXAMINATION:  GENERAL:  Patient is a well-developed, well-  nourished male in no acute distress.  VITAL SIGNS:  Blood pressure is 136/85, pulse rate is 70, temperature is  98.4.  HEAD/NECK:  There is significant right neck lymphadenopathy.  There is  no left neck lymphadenopathy noted.  The patient denies acute TMJ  symptoms.  The maximum interincisal opening is measured at 42 mm.  INTRAORAL EXAM:  The patient with normal saliva.  The patient with  incipient xerostomia and foamy saliva.  There is no evidence of abscess  formation within the  mouth.  DENTITION:  Patient with multiple missing teeth, numbers 1, 4, 16.  PERIODONTAL:  Patient with chronic advanced periodontal disease with  plaque and calculus accumulations, generalized gingival recession,  incipient tooth mobility and incipient to moderate bone loss.  DENTAL CARIES:  There are multiple dental caries as per dental charting  form.  ENDODONTIC:  The patient currently denies acute pulpitis symptoms.  The  patient had may have incipient periapical pathology involving tooth #14.  CROWN OR BRIDGE:  There are no crown or bridge restorations.  PROSTHODONTIC:  There are no dentures.  OCCLUSION:  Patient with a poor occlusal scheme but a stable occlusion  at this time.   RADIOGRAPHIC INTERPRETATION:  A panoramic x-ray was taken and  supplemented with full series dental radiographs.   There are multiple missing teeth.  There is incipient to moderate bone  loss.  There are dental caries.  There may be incipient periapical  pathology involving tooth #14.  There are several areas of significant  periodontal disease involving the molars.   ASSESSMENT:  1. History of oral neglect.  2. Chronic periodontitis with bone loss.  3. Generalized gingival recession.  4. Incipient tooth mobility.  5. Multiple missing teeth.  6. Supereruption and drifting of the unopposed teeth into the      edentulous areas.  7. Incipient xerostomia.  8. No history of dentures.  9. Poor occlusal scheme but a stable occlusion.  10.Dental caries per dental charting form.   PLAN/RECOMMENDATIONS:  1. I discussed the risks, benefits and complications of various      treatment options with the patient in relationship to his medical      and dental conditions and anticipated chemoradiation therapy, and      chemoradiation therapy side effects to include xerostomia,      radiation caries, trismus, mucositis, taste changes, gum or jaw      bone changes, and risk for infection, bleeding and       osteoradionecrosis.  We discussed various treatment options to      include no treatment, extraction of tooth numbers 2, 3, 14, 15, 17,      18, 31 and 32 with alveoloplasty  to achieve primary closure,      periodontal therapy, dental restorations, crown or bridge therapy,      implant therapy, root canal therapy, and replacing missing teeth as      indicated after adequate healing.  We also discussed fabrication of      fluoride trays and scatter protection devices.  The patient      currently wishes to obtain a second opinion from Dr. Cherly Anderson      for evaluation of the aforementioned extractions with      alveoloplasty.  The patient will then contact dental medicine      concerning his desire to proceed with extraction with      alveoloplasty, periodontal therapy, dental restorations, and      insertion of fluoride trays and scatter protection devices.  The      patient did agree to impressions for the fabrication of fluoride      trays and scatter protection devices for today.  2. Discussion of findings with Drs. Mohorn, Mohamed and Bronson.  We      will assist in coordination of referral and subsequent      chemoradiation therapy as indicated.  3. Provided written and verbal information on Radiation therapy and      your mouth, as well as Chemotherapy and your mouth today.  4. Provided patient with a trismus device today with adjustments to      the maximum interincisal opening of 42 mm.      Charlynne Pander, D.D.S.  Electronically Signed     RFK/MEDQ  D:  06/26/2007  T:  06/27/2007  Job:  [id number]   cc:   Radene Gunning, MD, PhD  Fax: [phone]   Lajuana Matte, MD  Fax: [phone]   Griffith Citron Mohorn, D.D.S.  Fax: [phone]

## 2010-12-30 NOTE — Consult Note (Signed)
NAME:  Mitchell Davis, Mitchell Davis NO.:  [id number]   MEDICAL RECORD NO.:  [id number]                   PATIENT TYPE:  EMS   LOCATION:  ED                                   FACILITY:  [facility]   PHYSICIAN:  Ollen Gross. Vernell Morgans, M.D.              DATE OF BIRTH:  07-16-1951   DATE OF CONSULTATION:  02/12/2003  DATE OF DISCHARGE:                                   CONSULTATION   HISTORY OF PRESENT ILLNESS:  The patient is a 60 year old white male who has  a history of uncontrolled diabetes who presents to the emergency department  with a five day history of painful, swollen neck.  He has had no drainage.  He is not sure whether he has had fevers or not.  Pain has worsened during  these five days.  It does not radiate anywhere, but is localized in the left  posterior neck.   REVIEW OF SYSTEMS:  He denies any nausea, vomiting, fevers, chills, chest  pain, shortness of breath, diarrhea, or dysuria.  The rest of his review of  systems is unremarkable.   PAST MEDICAL HISTORY:  Uncontrolled diabetes.  He cannot afford to buy his  medicines.   PAST SURGICAL HISTORY:  1. Amputation of a couple of fingers.  2. Sinus surgery.   MEDICATIONS:  None.   ALLERGIES:  None.   SOCIAL HISTORY:  He smokes about a pack of cigarettes a day and denies any  alcohol use.   FAMILY HISTORY:  Noncontributory.   PHYSICAL EXAMINATION:  GENERAL:  He is a well-developed, well-nourished  white male in no acute distress.  VITAL SIGNS:  Temperature 100.6, blood pressure 152/89, pulse 127.  SKIN:  Warm and dry with no jaundice.  HEENT:  Eyes:  Extraocular movements are intact.  Pupils are equal, round,  and react to light.  Sclerae nonicteric.  NECK:  Very swollen and tender on the left posterior region.  He has a lot  of induration and redness there, but no palpable fluctuance.  Otherwise, his  trachea is midline.  I cannot hear any bruits.  LUNGS:  Clear bilaterally with no use of accessory  respiratory muscles.  HEART:  Regular rate and rhythm with an impulse in the left chest.  ABDOMEN:  Soft and nontender.  I cannot palpate any masses or  hepatosplenomegaly.  EXTREMITIES:  No clubbing, cyanosis, edema.  PSYCHOLOGIC:  He is alert and oriented x3 with no evidence of anxiety or  depression.   LABORATORIES:  White count 31,000, hemoglobin 16.2, hematocrit 46.1,  platelet count 263,000.  His sodium was 129, potassium 3.4, chloride 96, CO2  22, BUN 9, creatinine 0.8, glucose 253.   ASSESSMENT/PLAN:  This is a 60 year old white male with uncontrolled  diabetes from poor compliance who has obvious soft tissue infection of his  left posterior neck.  I cannot palpate any fluctuance  at this time.  Recommended  CT scan of his neck to look for any evidence of abscess.  If there is fluid  in this area then I think he would benefit from going to the operating room  to have this drained.  If there is only inflammation in the soft tissues  then we will plan to put him on intravenous antibiotics and treat the area  locally and follow him clinically.                                               Ollen Gross. Vernell Morgans, M.D.    PST/MEDQ  D:  02/12/2003  T:  02/13/2003  Job:  [id number]

## 2010-12-30 NOTE — Op Note (Signed)
NAME:  Mitchell Davis, Mitchell Davis NO.:  [id number]   MEDICAL RECORD NO.:  [id number]                   PATIENT TYPE:  INP   LOCATION:  0348                                 FACILITY:  [facility]   PHYSICIAN:  Sandria Bales. Ezzard Standing, M.D.               DATE OF BIRTH:  02-Apr-1951   DATE OF PROCEDURE:  02/14/2003  DATE OF DISCHARGE:                                 OPERATIVE REPORT   PREOPERATIVE DIAGNOSIS:  Abscess left neck.   POSTOPERATIVE DIAGNOSIS:  Abscess with multiloculation, multiseptation of  left neck.   OPERATION/PROCEDURE:  Incision and drainage and debridement of left neck  abscess.   SURGEON:  Sandria Bales. Ezzard Standing, M.D.   ANESTHESIA:  General endotracheal anesthesia.   ESTIMATED BLOOD LOSS:  100 mL.   DRAINS:  Drain was left in and I packed it with Betadine-soaked gauze.   INDICATIONS FOR PROCEDURE:  [name] is a 60 year old white male admitted  to [facility] by [facility] for poorly controlled  diabetes mellitus and a left neck abscess. This left neck abscess got worse  despite antibiotics and now comes for incision and drainage of this abscess  in the operating room.  The indications and potential complications were  explained to the patient.  Potential complications including but not limited  to continued infection, bleeding, repeat debridement, nerve injury.   DESCRIPTION OF PROCEDURE:  The patient was given a general anesthetic.  He  was already on Zosyn as an antibiotic.  The patient was placed in the right  lateral decubitus position.  The neck was prepped with Betadine solution and  sterilely draped.  I cored out an area from the abscess which was probably  about 6 cm in length, about 3 cm in width.  I went down to what I thought  was the bottom.  It was really a multiloculated, multiseptated abscess but  not really in one distinct abscess which was carried away.  I irrigated the  wound thoroughly, tried to squeeze out as  much of the pus I could in  breaking up the different loculations and I packed it with Betadine gauze  and serially dressed it.   This has to be evaluated on a daily basis on whether we need to repeat  debridement.  Hopefully within 48 hours he will show significant improvement  in his symptoms.                                               Sandria Bales. Ezzard Standing, M.D.    DHN/MEDQ  D:  02/14/2003  T:  02/14/2003  Job:  [id number]   cc:   Lilyan Punt. Sydnee Levans, M.D.  [address]  [place]  [place] [address]  Fax:  [phone]  

## 2010-12-30 NOTE — H&P (Signed)
NAME:  Mitchell Davis, Mitchell Davis NO.:  [id number]   MEDICAL RECORD NO.:  [id number]                   PATIENT TYPE:  EMS   LOCATION:  ED                                   FACILITY:  [facility]   PHYSICIAN:  Jackie Plum, M.D.             DATE OF BIRTH:  1951/07/05   DATE OF ADMISSION:  02/12/2003  DATE OF DISCHARGE:                                HISTORY & PHYSICAL   PROBLEM LIST:  1. Neck swelling with erythema, consistent with cellulitis, to rule out     abscess.  2. Leukocytosis (white blood cell count of 31,300), fever, and tachycardia     with peripheral smear showing toxic granulation tissue, quarry early     sepsis.  3. Hyponatremia with admission sodium of 129.  4. Hypokalemia, mild, with sodium of 3.4.  5. History of cigarette smoking, one pack a day for about 36 years.  6. History of diabetes mellitus, not on medications.   CHIEF COMPLAINT:  Left-sided neck swelling x5 days.   HISTORY OF PRESENT ILLNESS:  This gentleman is a 60 year old Caucasian who  migrated from [place] to [place] about 1-1/2 years ago on account of  employment.  He presents with about a five-day history of progressively  worsening left-sided neck swelling.  He denies any prior bites or any  itching to the skin but found that he had some mild swelling at the back of  his neck about five days ago which has gotten progressively worse.  This is  associated with fever and chills with some mild headache, without any  history of sore throat, cough, sputum production, shortness of breath, neck  stiffness, chest pain, abdominal pain, nausea, vomiting, diarrhea, or  constipation.  He had felt mildly confused at times prior to presentation to  the ED.   At the ED the patient was given IV antibiotics, and we were asked to admit  for further management.   PAST MEDICAL HISTORY:  As in problem list above.  Of note, the patient has  diabetes mellitus.  He does not have any primary care  physician in  [place].  He has been off his medications for more than a year now on  account of financial difficulties.   FAMILY HISTORY:  Notable for diabetes and heart disease.  His mother died  from complications of hypertension, and his father died from complications  from colon cancer.   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  He is not married.  Has two children who are not living with him.  He does  not drink alcohol but smokes greater than one pack a day since the age of  60.   REVIEW OF SYSTEMS:  Significant positives and negatives are as in HPI above,  otherwise unremarkable.   MEDICATIONS:  The patient is not taking any medications currently.   ALLERGIES:  He does not have any medication allergies.   PHYSICAL EXAMINATION:  VITAL SIGNS:  BP 152/89, pulse rate was 127 initially  but had come down to 112 per minute by the time of my evaluation, with  respiratory rate of 12, temperature of 100.6 degrees Fahrenheit, O2  saturation of 97% on room air.  GENERAL:  The patient was in moderate peripheral distress.  He was not toxic-  looking, however.  HEENT:  Normocephalic, atraumatic.  He was not pale, nonicteric.  Oropharynx  was moist with any irritation or erythema.  The TMs were within normal  limits.  NECK:  Posterior neck, on the left side, was notable for a 12 cm x 9 cm area  of erythema which was warm to touch, indurated.  However, there was no  convincing evidence of fluctuance.  It was moderately tender to palpation.  There was no evidence of neck stiffness.  No JVD was appreciated on neck  exam.  LUNGS:  Notable for vesicular breath sounds which were adequate, without any  rales or wheezes.  CARDIAC:  Moderately tachycardic, without any gallops or murmur.  ABDOMEN:  Full, nontender.  EXTREMITIES:  No clubbing, no edema.  CNS:  Alert and oriented x3.  No evidence of neurologic deficits.   LABORATORY WORK:  RBC count of 1.3,  hemoglobin 16.2, hematocrit 36.1, MCV  88.7, platelet count 263.  Absolute neutrophil count 26.9.  Sodium 129,  potassium 3.4, chloride 96, CO2 22, glucose 253, BUN 9, creatinine 0.8,  calcium 9.   ASSESSMENT:  Indurated, erythematous neck swelling consistent with  cellulitis, to rule out abscess.  Secondary early sepsis in view of his  leukocytosis, fever, and tachycardia may be present.   PLAN OF CARE:  Admit the patient to the hospitalist's service.  General  surgery has been consulted.  We are going to get a scan of the neck to rule  out abscess at this point.  If abscess is evident, the patient will have I&D  done by surgery.  We will start him on antibiotics and supportive care  including IV fluids, and analgesics will be offered.  Glucose control will  be monitored and addressed strictly.  Blood cultures will be drawn, and we  will take this opportunity to check his hemoglobin A1C and fasting lipid  panel when more stabilized.                                               Jackie Plum, M.D.    GO/MEDQ  D:  02/12/2003  T:  02/13/2003  Job:  [id number]   cc:   Lilyan Punt. Sydnee Levans, M.D.  [address]  [place]  [place] [address]  Fax: [phone]

## 2010-12-30 NOTE — Discharge Summary (Signed)
NAME:  Mitchell Davis, Mitchell Davis NO.:  [id number]   MEDICAL RECORD NO.:  [id number]                   PATIENT TYPE:  INP   LOCATION:  0348                                 FACILITY:  [facility]   PHYSICIAN:  Deirdre Peer. Polite, M.D.              DATE OF BIRTH:  1950/11/16   DATE OF ADMISSION:  02/12/2003  DATE OF DISCHARGE:  02/21/2003                                 DISCHARGE SUMMARY   DISCHARGE DIAGNOSES:  1. Posterior neck abscess status post incision and drainage by surgery.     Wound left to heal by secondary intention.  2. Leukocytosis, resolving.  3. Diabetes.  4. Probable chronic obstructive pulmonary disease.   DISCHARGE MEDICATIONS:  1. Augmentin 875 mg 1 every 12 hours.  2. Percocet 1-2 tabs q. 4-6h p.r.n. pain.  3. Glyburide 10 mg 1 every 12 hours.  4. Metformin 500 mg 1 every 12 hours.  5. Lantus 20 units at night.   DISPOSITION:  The patient was set up with home health nursing for wound  care.   FOLLOWUP:  The patient was given instructions to follow up at [facility].  The patient is asked to call surgery for followup appointment.   HISTORY OF PRESENT ILLNESS:  A 60 year old white male with known history of  diabetes, tobacco abuse, presented to the ED with complaints of left sided  neck swelling and pain. Evaluation in the ED found the patient to be  febrile. Enlarged mass in the posterior neck. CT was consistent with  probable abscess/myositis. Admission is deemed necessary for further  evaluation and treatment.   PAST MEDICAL HISTORY:  As stated above. Diabetes with poor diabetic control,  being off medications for greater than a year. Tobacco abuse and probable  COPD.   ADMISSION MEDICATIONS:  None.   SOCIAL HISTORY:  Positive tobacco. A pack a day. The patient denies alcohol  or drugs.   FAMILY HISTORY:  Known for diabetes and heart disease.   ALLERGIES:  No known drug allergies.   HOSPITAL COURSE:  1. The patient was admitted  to a medicine floor bed, started on empiric     antibiotics and IV Zosyn. Surgical consult was obtained. The patient had     I&D of neck process with culture showing staph. The patient had blood     cultures which were negative throughout this hospitalization. On     admission, the patient's white count was 30,000. At discharge, 15,000.     The remainder of the patient's hospitalization was primarily for control     of diabetes and wound care as outlined by surgery. The patient had some     local debridement x2 per surgery. The patient was afebrile without any     other constitutional symptoms. As stated, the patient's wound cultures     showed staph and blood cultures remained negative. On February 21, 2003  it     was felt that the patient could continue to be managed on an outpatient     basis per surgery and arrangements were made for the patient to be     discharged to home with outpatient wound care.  2. Diabetes. As stated, the patient has had poor control of diabetes     secondary to not taking medications. In the hospital, the patient had     fair diabetic control with Lantus, Glyburide and Metformin. Importance of     diabetic control was stressed to the patient, particularly with it's     effects on wound healing. The patient expressed understanding of this and     explained that he planned to continue to take his medicines as directed.     The patient had a hemoglobin A1C of 12.2. As his sugars are in the range     of 130 to 150 range with the above medical regimen, the patient is stable     for discharge. He will still need followup of his diabetes on an     outpatient basis for optimal control. This will most likely be done at     [facility] The patient has several other medical problems which were     stable, i.e. probable COPD. The patient has been advised to stop smoking.     There were no complications of that during this hospitalization.                                                Deirdre Peer. Polite, M.D.    RDP/MEDQ  D:  02/22/2003  T:  02/22/2003  Job:  [id number]

## 2011-01-17 LAB — COMPREHENSIVE METABOLIC PANEL
ALT: 24 U/L (ref 10–40)
AST: 24 U/L
Albumin: 4
Chloride: 104 mmol/L
Potassium: 4.6 mmol/L
Total Bilirubin: 0.7 mg/dL

## 2011-01-17 LAB — CBC AND DIFFERENTIAL
MCV: 92.9 fL
WBC: 6.7
platelet count: 201

## 2011-03-08 ENCOUNTER — Encounter: Payer: Self-pay | Admitting: Internal Medicine

## 2011-03-09 ENCOUNTER — Encounter: Payer: Self-pay | Admitting: Family Medicine

## 2011-03-09 ENCOUNTER — Ambulatory Visit (INDEPENDENT_AMBULATORY_CARE_PROVIDER_SITE_OTHER): Payer: 59 | Admitting: Family Medicine

## 2011-03-09 DIAGNOSIS — C099 Malignant neoplasm of tonsil, unspecified: Secondary | ICD-10-CM

## 2011-03-09 DIAGNOSIS — E119 Type 2 diabetes mellitus without complications: Secondary | ICD-10-CM

## 2011-03-09 DIAGNOSIS — I10 Essential (primary) hypertension: Secondary | ICD-10-CM

## 2011-03-09 DIAGNOSIS — F411 Generalized anxiety disorder: Secondary | ICD-10-CM

## 2011-03-09 DIAGNOSIS — F17200 Nicotine dependence, unspecified, uncomplicated: Secondary | ICD-10-CM

## 2011-03-09 DIAGNOSIS — Z8719 Personal history of other diseases of the digestive system: Secondary | ICD-10-CM

## 2011-03-09 MED ORDER — CITALOPRAM HYDROBROMIDE 10 MG PO TABS: 10.0000 mg | ORAL_TABLET | Freq: Every day | ORAL | Status: DC

## 2011-03-09 MED ORDER — LANCETS MISC: Status: AC

## 2011-03-09 MED ORDER — GLUCOSE BLOOD VI STRP: ORAL_STRIP | Status: DC

## 2011-03-09 NOTE — Assessment & Plan Note (Addendum)
Will request records from Dr. Lendell Caprice. To see next month. Doesn't feel ready to go back to work.  Worried about losing job. Considering unemployment and disability. Pain meds from onc currently.

## 2011-03-09 NOTE — Progress Notes (Addendum)
Subjective:    Patient ID: Mitchell Davis, male    DOB: December 12, 1950, 60 y.o.   MRN: [id number]  HPI CC: new patient, establish  Previous pt of Dr. Drue Novel, establish here.  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  Off zocor.  Saw onc Dr. Mammie Russian (med onc) at [facility].  To see next week.  Finds trouble controlling pain at end of 3d cycle, uses fentanyl patch Q72 hours as well as oxycodone IR.  Pain in ears, swelling in left neck/cheek.  Trouble eating.  Had feeding tube, fell out.  Drinking 5 gall milk/wk to gain some weight.  Advised against this.  Wt Readings from Last 3 Encounters:  03/09/11 171 lb 12 oz (77.905 kg)  03/25/08 166 lb 6.4 oz (75.479 kg)  12/24/07 162 lb 3.2 oz (73.573 kg)   Endorses depression - thinks about children/grandchildren to cheer himself up.  Would consider medicine to help.  Denies SI/HI.  Has had counseling in past.  Daughter and grandchild not in town.  States colonosocpy done 2011, but none in chart.  Medications and allergies reviewed and updated in chart. Patient Active Problem List  Diagnoses  . NEOP, MALIGNANT, TONSIL  . DIABETES MELLITUS, TYPE II  . HYPERTRIGLYCERIDEMIA  . ANXIETY  . NICOTINE ADDICTION  . HYPERTENSION  . COLONOSCOPY, HX OF   Past Medical History  Diagnosis Date  . Diabetes mellitus type II     unsure how sugars have been running  . HTN (hypertension)   . Hypertriglyceridemia   . Anxiety   . 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 History of chicken pox   . Depression   . Seasonal allergies    Past Surgical History  Procedure Date  . Tonsillectomy 2008    tonsil cancer   History  Substance Use Topics  . Smoking status: Former Smoker    Quit date: 09/14/2010  . Smokeless tobacco: Never Used  . Alcohol Use: No   Family History  Problem Relation Age of Onset  . Colon cancer Father     1st  degree relative <60  . Coronary artery disease Mother   . Stroke Mother 64  . Heart disease Mother   . Asthma Brother   . Diabetes Brother   . Stroke Brother    Allergies  Allergen Reactions  . WUJ:WJXBJYNWGNF+AOZHYQMVH+QIONGEXBMW Acid+Aspartame Rash   Current Outpatient Prescriptions on File Prior to Visit  Medication Sig Dispense Refill  . simvastatin (ZOCOR) 20 MG tablet Take 20 mg by mouth at bedtime.        . varenicline (CHANTIX) 1 MG tablet Take 1 mg by mouth as directed.         Review of Systems  Constitutional: Negative for fever, chills, activity change, appetite change, fatigue and unexpected weight change.  HENT: Negative for hearing loss and neck pain.   Eyes: Negative for visual disturbance.  Respiratory: Negative for cough, chest tightness, shortness of breath and wheezing.   Cardiovascular: Negative for chest pain, palpitations and leg swelling.  Gastrointestinal: Negative for nausea, vomiting, abdominal pain, diarrhea, constipation, blood in stool and abdominal distention.  Genitourinary: Negative for hematuria and difficulty urinating.  Musculoskeletal: Negative for myalgias and arthralgias.  Skin: Negative for rash.  Neurological: Negative for dizziness, seizures, syncope and headaches.  Hematological: Does not bruise/bleed easily.  Psychiatric/Behavioral: Positive for dysphoric mood.  The patient is not nervous/anxious.        Objective:   Physical Exam  Nursing note and vitals reviewed. Constitutional: He is oriented to person, place, and time. He appears well-developed and well-nourished. No distress.       thin  HENT:  Head: Normocephalic and atraumatic.  Right Ear: Hearing, tympanic membrane, external ear and ear canal normal.  Left Ear: Hearing, tympanic membrane, external ear and ear canal normal.  Nose: Nose normal. No mucosal edema or rhinorrhea.  Mouth/Throat: Uvula is midline. Posterior oropharyngeal edema present. No oropharyngeal exudate or  posterior oropharyngeal erythema.       Left cheek swelling compared to right, tender.  Eyes: Conjunctivae and EOM are normal. Pupils are equal, round, and reactive to light.  Neck: Normal range of motion. Neck supple. No thyromegaly present.  Cardiovascular: Normal rate, regular rhythm, normal heart sounds and intact distal pulses.   No murmur heard. Pulses:      Radial pulses are 2+ on the right side, and 2+ on the left side.  Pulmonary/Chest: Effort normal and breath sounds normal. No respiratory distress. He has no wheezes. He has no rales.  Abdominal: Soft. Bowel sounds are normal. He exhibits no distension and no mass. There is no tenderness. There is no rebound and no guarding.  Musculoskeletal: Normal range of motion.  Lymphadenopathy:    He has no cervical adenopathy.  Neurological: He is alert and oriented to person, place, and time.       CN grossly intact, station and gait intact  Skin: Skin is warm and dry. No rash noted.  Psychiatric: His behavior is normal. Judgment and thought content normal. He exhibits a depressed mood.       Easily tears with discussion of stress/ mood          Assessment & Plan:

## 2011-03-09 NOTE — Assessment & Plan Note (Signed)
Quit smoking 2012. Encouraged continued abstinence.

## 2011-03-09 NOTE — Assessment & Plan Note (Signed)
Stable off meds. Continue to monitor. May draw blood work next visit.  None recent. Refilled test strips as pt had run out. RTC 2 mo for f/u. Lab Results  Component Value Date   HGBA1C 6.8* 04/01/2008

## 2011-03-09 NOTE — Patient Instructions (Addendum)
Start celexa 10mg  daily for mood.  Watch for stomach upset with this.  It should take 2-3 weeks to take effect.  Don't stop suddenly, let me know if you want to come of it and we will slowly take off. Return to see me in 1 month. Good to meet you today, call us with questions.

## 2011-03-09 NOTE — Assessment & Plan Note (Signed)
Stable off meds. Continue to monitor. Significant weight loss.

## 2011-03-09 NOTE — Assessment & Plan Note (Signed)
Discussed mood. A lot on his plate currently. Interested in pharmacotherapy. Start celexa 10mg  daily, discussed to stay on for next 3-4 wks to see if any improvement. RTC 1 mo for f/u.

## 2011-03-14 ENCOUNTER — Encounter: Payer: Self-pay | Admitting: Family Medicine

## 2011-04-11 ENCOUNTER — Encounter: Payer: Self-pay | Admitting: Family Medicine

## 2011-04-11 ENCOUNTER — Ambulatory Visit (INDEPENDENT_AMBULATORY_CARE_PROVIDER_SITE_OTHER): Payer: 59 | Admitting: Family Medicine

## 2011-04-11 VITALS — BP 124/80 | HR 110 | Temp 98.5°F | Wt 169.8 lb

## 2011-04-11 DIAGNOSIS — F341 Dysthymic disorder: Secondary | ICD-10-CM

## 2011-04-11 DIAGNOSIS — C099 Malignant neoplasm of tonsil, unspecified: Secondary | ICD-10-CM

## 2011-04-11 DIAGNOSIS — E781 Pure hyperglyceridemia: Secondary | ICD-10-CM

## 2011-04-11 DIAGNOSIS — I10 Essential (primary) hypertension: Secondary | ICD-10-CM

## 2011-04-11 DIAGNOSIS — F17200 Nicotine dependence, unspecified, uncomplicated: Secondary | ICD-10-CM

## 2011-04-11 DIAGNOSIS — E119 Type 2 diabetes mellitus without complications: Secondary | ICD-10-CM

## 2011-04-11 DIAGNOSIS — F419 Anxiety disorder, unspecified: Secondary | ICD-10-CM

## 2011-04-11 LAB — CBC WITH DIFFERENTIAL/PLATELET
Basophils Absolute: 0 10*3/uL (ref 0.0–0.1)
Eosinophils Relative: 0.9 % (ref 0.0–5.0)
Lymphocytes Relative: 7.7 % — ABNORMAL LOW (ref 12.0–46.0)
Monocytes Relative: 5.1 % (ref 3.0–12.0)
Neutrophils Relative %: 86.2 % — ABNORMAL HIGH (ref 43.0–77.0)
Platelets: 227 10*3/uL (ref 150.0–400.0)
RDW: 13.7 % (ref 11.5–14.6)
WBC: 16.1 10*3/uL — ABNORMAL HIGH (ref 4.5–10.5)

## 2011-04-11 LAB — MICROALBUMIN / CREATININE URINE RATIO
Creatinine,U: 255.6 mg/dL
Microalb, Ur: 9.5 mg/dL — ABNORMAL HIGH (ref 0.0–1.9)

## 2011-04-11 LAB — TSH: TSH: 6.29 u[IU]/mL — ABNORMAL HIGH (ref 0.35–5.50)

## 2011-04-11 LAB — LDL CHOLESTEROL, DIRECT: Direct LDL: 94.9 mg/dL

## 2011-04-11 LAB — BASIC METABOLIC PANEL
BUN: 19 mg/dL (ref 6–23)
Calcium: 9.8 mg/dL (ref 8.4–10.5)
GFR: 66.8 mL/min (ref >60.00)
Glucose, Bld: 146 mg/dL — ABNORMAL HIGH (ref 70–99)

## 2011-04-11 MED ORDER — CITALOPRAM HYDROBROMIDE 20 MG PO TABS: 20.0000 mg | ORAL_TABLET | Freq: Every day | ORAL | Status: DC

## 2011-04-11 NOTE — Assessment & Plan Note (Signed)
Remains abstinent from tobacco, encouraged.

## 2011-04-11 NOTE — Assessment & Plan Note (Signed)
Significant improvement per pt report.   Interested in increase, will titrate up to 20mg  celexa daily. rtc 2 mo for f/u.

## 2011-04-11 NOTE — Patient Instructions (Addendum)
Start 2 pills of celexa daily, new script will be for 1 pill a day (20mg  daily).  Don't suddenly stop. Blood work today - we will call you with results. Return in 2 months for follow up. I will contact you when the form is ready for pickup.

## 2011-04-11 NOTE — Assessment & Plan Note (Signed)
To see onc next week. Pain meds from surgery/onc. Awaiting records from Dr. Lendell Caprice. Filled out form pt brought today, will call to come pick up. In current status pt unable to return to full day of work, may need input from oncologist re: ability to return to working status.

## 2011-04-11 NOTE — Assessment & Plan Note (Signed)
Stable off meds. Continue to monitor. 

## 2011-04-11 NOTE — Progress Notes (Signed)
Subjective:    Patient ID: Mitchell Davis, male    DOB: 05/24/1951, 60 y.o.   MRN: [id number]  HPI CC: 1 mo f/u  Mood - significant improvement on celexa 10mg  daily.  No longer cries easily.  Feels some improvement but thinks could do better.  Still some depression and apathy.  + anhedonia.  No SI/HI.  Interested in increased dose.  Still abstinent from smoking.  Not taking chantix, have removed from list.  DM - no recent lab check.  Has been checking sugars - they run 90-220 throughout day (not all fasting).  No recent foot exam.  H/o tonsillar squamous cell cancer - to see Dr. Chrystine Oiler next week (onc).  Dr. Lendell Caprice surgeon in 75mo.  Continues fatigue, doesn't think able to do work in current state.  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  Looking into getting into medicare, thinks will lose job.    Medications and allergies reviewed and updated in chart.  Past histories reviewed and updated if relevant as below. Patient Active Problem List  Diagnoses  . NEOP, MALIGNANT, TONSIL  . DIABETES MELLITUS, TYPE II  . HYPERTRIGLYCERIDEMIA  . Anxiety and depression  . NICOTINE ADDICTION  . HYPERTENSION  . COLONOSCOPY, HX OF   Past Medical History  Diagnosis Date  . Diabetes mellitus type II     unsure how sugars have been running  . HTN (hypertension)   . Hypertriglyceridemia   . Anxiety   . 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 History of chicken pox   . Depression   . Seasonal allergies    Past Surgical History  Procedure Date  . Tonsillectomy 2008    tonsil cancer   History  Substance Use Topics  . Smoking status: Former Smoker    Quit date: 09/14/2010  . Smokeless tobacco: Never Used  . Alcohol Use: No   Family History  Problem Relation Age of Onset  . Colon cancer Father     1st degree relative <60  . Coronary artery disease Mother   . Stroke Mother 60  . Heart disease Mother   . Asthma Brother   . Diabetes Brother    . Stroke Brother    Allergies  Allergen Reactions  . Augmentin Rash   Current Outpatient Prescriptions on File Prior to Visit  Medication Sig Dispense Refill  . erlotinib (TARCEVA) 150 MG tablet Take 150 mg by mouth daily. Take on an empty stomach 1 hour before meals or 2 hours after.       . fentaNYL (DURAGESIC - DOSED MCG/HR) 50 MCG/HR Place 1 patch onto the skin every 3 (three) days.        . folic acid (GNP FOLIC ACID) 400 MCG tablet Take 400 mcg by mouth daily.        Marland Kitchen glucose blood test strip Freestyle Flash Lite  100 each  11  . Lancets MISC Freestyle Flash Lite, use as directed  100 each  11  . Magnesium 250 MG TABS Take 1 tablet by mouth daily.        Marland Kitchen oxycodone (OXY-IR) 5 MG capsule Take 5 mg by mouth every 4 (four) hours as needed.        . simvastatin (ZOCOR) 20 MG tablet Take 20 mg by mouth at bedtime.         Review of Systems Per HPI    Objective:   Physical Exam  Nursing note  and vitals reviewed. Constitutional: He appears well-developed and well-nourished. No distress.       Stiff with movements, slowed gait  HENT:  Head: Normocephalic and atraumatic.  Right Ear: External ear normal.  Left Ear: External ear normal.  Nose: Nose normal.  Mouth/Throat: Oropharynx is clear and moist. No oropharyngeal exudate.  Eyes: Conjunctivae and EOM are normal. Pupils are equal, round, and reactive to light. No scleral icterus.  Neck: Normal range of motion. Neck supple.  Cardiovascular: Normal rate, regular rhythm, normal heart sounds and intact distal pulses.   No murmur heard. Pulmonary/Chest: Effort normal and breath sounds normal. No respiratory distress. He has no wheezes. He has no rales.  Musculoskeletal:       Diabetic foot exam: Normal inspection No skin breakdown No calluses  Diminished DP/PT pulses bilaterally Normal sensation to light tough and monofilament Nails normal  Lymphadenopathy:    He has no cervical adenopathy.  Skin: Skin is warm and dry. No  rash noted.  Psychiatric: He has a normal mood and affect.          Assessment & Plan:

## 2011-04-11 NOTE — Assessment & Plan Note (Signed)
Check blood work and microalb today as due. Foot exam today.

## 2011-04-11 NOTE — Assessment & Plan Note (Signed)
On zocor, tolerating well. Check LDL today, nonfasting.

## 2011-04-12 ENCOUNTER — Telehealth: Payer: Self-pay | Admitting: Family Medicine

## 2011-04-12 LAB — T3, FREE: T3, Free: 2.8 pg/mL (ref 2.3–4.2)

## 2011-04-12 LAB — T4, FREE: Free T4: 0.78 ng/dL (ref 0.60–1.60)

## 2011-04-12 NOTE — Telephone Encounter (Addendum)
Please notify A1c elevated at 7.3%, will discuss possible med for diabetes at follow up visit. Thyroid function returned low - but actual hormone levels ok - will monitor this. Kidneys normal. White count returned elevated - measure of infection.  Has he had any fevers, cough, urinary symptoms or skin infection? Cholesterol level at goal.

## 2011-04-13 NOTE — Telephone Encounter (Signed)
Message left notifying patient. Advised to return my call and advise about any fevers, cough, etc that would account for elevated WBC's.

## 2011-04-14 ENCOUNTER — Telehealth: Payer: Self-pay | Admitting: *Deleted

## 2011-04-14 NOTE — Telephone Encounter (Signed)
Patient called back and said he has had some dental issues/infected teeth, so that's probably where the WBC elevation is coming from. He is having some dental work done in about 2 weeks to take care of it.

## 2011-04-14 NOTE — Telephone Encounter (Signed)
Noted. Thanks.

## 2011-05-12 LAB — GLUCOSE, CAPILLARY: Glucose-Capillary: 143 — ABNORMAL HIGH

## 2011-06-15 ENCOUNTER — Telehealth: Payer: Self-pay | Admitting: *Deleted

## 2011-06-15 NOTE — Telephone Encounter (Signed)
Patient dropped off disability forms for completion. They are in your IN box.

## 2011-06-16 NOTE — Telephone Encounter (Signed)
Has apt Monday.  Will discuss then.

## 2011-06-19 ENCOUNTER — Encounter: Payer: Self-pay | Admitting: Family Medicine

## 2011-06-19 ENCOUNTER — Ambulatory Visit (INDEPENDENT_AMBULATORY_CARE_PROVIDER_SITE_OTHER): Payer: 59 | Admitting: Family Medicine

## 2011-06-19 VITALS — BP 124/84 | HR 88 | Temp 97.9°F | Wt 173.4 lb

## 2011-06-19 DIAGNOSIS — F17200 Nicotine dependence, unspecified, uncomplicated: Secondary | ICD-10-CM

## 2011-06-19 DIAGNOSIS — C099 Malignant neoplasm of tonsil, unspecified: Secondary | ICD-10-CM

## 2011-06-19 DIAGNOSIS — E119 Type 2 diabetes mellitus without complications: Secondary | ICD-10-CM

## 2011-06-19 NOTE — Assessment & Plan Note (Addendum)
Majority of visit spent discussing disability and filling out form for permanent disability.  See scanned form. Does not know specific dates of surgeries, treatments.  Filled out forms as best able to. A total of 25 minutes were spent face-to-face with the patient during this encounter and over half of that time was spent on counseling and coordination of care. I do think pt unable to work in current condition, did defer final recs according to onc.

## 2011-06-19 NOTE — Patient Instructions (Addendum)
Try to back off dulcolax, ok to use miralax or colace daily to prevent constipation while on narcotics. I provided you with copy of disability form to take with you. Please return in 1 month for follow up diabetes - we may discuss starting medication to better control this at that time. Please call us with questions.
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
## 2011-06-19 NOTE — Assessment & Plan Note (Signed)
congratulated and encouraged continued abstinence.

## 2011-06-19 NOTE — Assessment & Plan Note (Signed)
Discussed A1c 7.3%. No added meds today. rtc 1 mo for recheck and further discussion then.

## 2011-06-20 ENCOUNTER — Ambulatory Visit: Payer: 59 | Admitting: Family Medicine

## 2011-07-17 ENCOUNTER — Encounter: Payer: Self-pay | Admitting: Family Medicine

## 2011-07-17 ENCOUNTER — Ambulatory Visit (INDEPENDENT_AMBULATORY_CARE_PROVIDER_SITE_OTHER): Payer: 59 | Admitting: Family Medicine

## 2011-07-17 VITALS — BP 124/78 | HR 84 | Temp 98.3°F | Wt 171.2 lb

## 2011-07-17 DIAGNOSIS — E119 Type 2 diabetes mellitus without complications: Secondary | ICD-10-CM

## 2011-07-17 DIAGNOSIS — K5900 Constipation, unspecified: Secondary | ICD-10-CM | POA: Insufficient documentation

## 2011-07-17 DIAGNOSIS — F17200 Nicotine dependence, unspecified, uncomplicated: Secondary | ICD-10-CM

## 2011-07-17 DIAGNOSIS — I10 Essential (primary) hypertension: Secondary | ICD-10-CM

## 2011-07-17 LAB — BASIC METABOLIC PANEL
CO2: 28 mEq/L (ref 19–32)
Calcium: 9.6 mg/dL (ref 8.4–10.5)
Sodium: 139 mEq/L (ref 135–145)

## 2011-07-17 LAB — TSH: TSH: 3.41 u[IU]/mL (ref 0.35–5.50)

## 2011-07-17 MED ORDER — DOCUSATE SODIUM 100 MG PO CAPS: 100.0000 mg | ORAL_CAPSULE | Freq: Every day | ORAL | Status: AC

## 2011-07-17 NOTE — Assessment & Plan Note (Signed)
Chronic. Stable. Check A1c today, if elevated start metformin. Discussed metformin with pt today. A1c previously 7.3%.

## 2011-07-17 NOTE — Progress Notes (Signed)
  Subjective:    Patient ID: Mitchell Davis, male    DOB: 02-06-1951, 60 y.o.   MRN: [id number]  HPI CC: 1 mo f/u DM  60 yo with recurrent squamous cell carcinoma of tongue/tonsil.  Had PET scan last month.  DM - previously diet controlled.  sugars running mildly elevated.  Mostly fasting am cbg's - 90-146.  No lows.  Diabetic foot exam 04/11/2011.  Last vision screen 1 1/2 yrs ago.  Decreased appetite.   Lab Results  Component Value Date   HGBA1C 7.3* 04/11/2011    Lab Results  Component Value Date   CREATININE 1.2 04/11/2011   1 wk ago had purulent and sanguinous discharge from top of mouth.  Saw ENT, Dr. Lendell Caprice, who rec monitoring for now.  No fevers/chills, pain still present.  On fentanyl and oxycodone.  1 soft stool/day, no constipation.  Quit smoking 09/2010.  To have dental surgery 07/25/2011.  Wt Readings from Last 3 Encounters:  07/17/11 171 lb 4 oz (77.678 kg)  06/19/11 173 lb 6.4 oz (78.654 kg)  04/11/11 169 lb 12 oz (76.998 kg)   Review of Systems Per HPI    Objective:   Physical Exam  Nursing note and vitals reviewed. Constitutional: He appears well-developed and well-nourished. No distress.  HENT:  Head: Normocephalic and atraumatic.  Mouth/Throat: Uvula is midline. Posterior oropharyngeal erythema present. No oropharyngeal exudate, posterior oropharyngeal edema or tonsillar abscesses.       Erythematous mucous membranes  Eyes: Conjunctivae and EOM are normal. Pupils are equal, round, and reactive to light. No scleral icterus.  Neck: Normal range of motion.       Hardened neck  Cardiovascular: Normal rate, regular rhythm, normal heart sounds and intact distal pulses.   No murmur heard. Pulmonary/Chest: Effort normal and breath sounds normal. No respiratory distress. He has no wheezes. He has no rales.  Musculoskeletal: He exhibits no edema.  Lymphadenopathy:    He has no cervical adenopathy.  Skin: Skin is warm and dry. No rash noted.  Psychiatric: He  has a normal mood and affect.       Assessment & Plan:

## 2011-07-17 NOTE — Assessment & Plan Note (Signed)
Stable off meds. ?

## 2011-07-17 NOTE — Assessment & Plan Note (Signed)
H/o narcotic induced constipation. rec start docusate daily as well as miralax prn

## 2011-07-17 NOTE — Patient Instructions (Signed)
For bowels, take docusate once daily, hold for diarrhea.  If getting constipated on this, may take miralax powder. Blood work today to check on diabetes. If sugars staying elevated, may start you on metformin.  We will call you with results. Good to see you today, call us with questions. Return in 3 months for follow up, sooner if needed.

## 2011-07-17 NOTE — Assessment & Plan Note (Signed)
Quit.  encouraged abstinence.

## 2011-10-15 ENCOUNTER — Encounter: Payer: Self-pay | Admitting: Family Medicine

## 2011-10-16 ENCOUNTER — Ambulatory Visit (INDEPENDENT_AMBULATORY_CARE_PROVIDER_SITE_OTHER): Payer: 59 | Admitting: Family Medicine

## 2011-10-16 ENCOUNTER — Encounter: Payer: Self-pay | Admitting: Family Medicine

## 2011-10-16 VITALS — BP 150/92 | HR 86 | Temp 98.6°F | Wt 181.8 lb

## 2011-10-16 DIAGNOSIS — I10 Essential (primary) hypertension: Secondary | ICD-10-CM

## 2011-10-16 DIAGNOSIS — C099 Malignant neoplasm of tonsil, unspecified: Secondary | ICD-10-CM

## 2011-10-16 DIAGNOSIS — E119 Type 2 diabetes mellitus without complications: Secondary | ICD-10-CM

## 2011-10-16 DIAGNOSIS — K089 Disorder of teeth and supporting structures, unspecified: Secondary | ICD-10-CM

## 2011-10-16 MED ORDER — GLUCOSE BLOOD VI STRP: ORAL_STRIP | Status: DC

## 2011-10-16 MED ORDER — LISINOPRIL 5 MG PO TABS: 5.0000 mg | ORAL_TABLET | Freq: Every day | ORAL | Status: DC

## 2011-10-16 NOTE — Assessment & Plan Note (Signed)
Elevated today.  discussed goal with DM. Start low dose ACEI, rtc 2 wks for Cr check. Discussed side effects to watch out for. Pt agrees with plan.

## 2011-10-16 NOTE — Patient Instructions (Addendum)
Start low dose blood pressure medicine called lisinopril.  Watch for tongue/lip swelling or dry cough. Goal blood pressure <130/80 with diabetes. Diabetes is doing well today, no changes there. Return in 2 weeks for labwork, return in 3 months for recheck.

## 2011-10-16 NOTE — Progress Notes (Signed)
  Subjective:    Patient ID: Mitchell Davis, male    DOB: May 25, 1951, 61 y.o.   MRN: [id number]  HPI CC: 3 mo f/u DM  61 yo with recurrent squamous cell carcinoma of tongue/tonsil. Saw Dr. Lendell Caprice (ENT) 10/2011, rec f/u 6 mo, no evidence of recurrence  DM - fasting sugars running 90s.  No lows. Last diabetic foot exam 04/11/2011 - done today. Last vision screen 2 yrs ago.  No paresthesias.  Tries to check sugars bid.  Diet controlled.  Requests refill of strips. Lab Results  Component Value Date   HGBA1C 6.9* 07/17/2011   HTN - bp elevated today.  Ate beans with pork and salt yesterday.  Diet controlled in past.  No HA, vision changes, CP/tightness, SOB, leg swelling.  bp at home running a bit elevated as well - 110-130/80-90s  Left tooth extraction 8 wks ago.  Very slowly healing, wonders if needs more antibiotics.  Next to see dentist 11/06/2011.  Dentist has said to let body heal on its own, no need for abx.  To see Dr. Toma Aran at onc 10/26/2011.  Past Medical History  Diagnosis Date  . Diabetes mellitus type II 1996  . HTN (hypertension)   . Hypertriglyceridemia   . Anxiety   . 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  first right then left.  s/p surgery; XRT; Chemo  . History of chicken pox   . Depression   . Seasonal allergies    Review of Systems Per HPI    Objective:   Physical Exam  Nursing note and vitals reviewed. Constitutional: He appears well-developed and well-nourished. No distress.  HENT:  Head: Normocephalic and atraumatic.  Right Ear: External ear normal.  Left Ear: External ear normal.  Nose: Nose normal.  Mouth/Throat: Oropharynx is clear and moist. Abnormal dentition. No oropharyngeal exudate.       Poor dentition.  Several teeth absent.  Left lower molars absent, has slowly healing granulation tissue present, some tenderness to inner gum palpation present.  No erythema or drainage.  Eyes: Conjunctivae and EOM are  normal. Pupils are equal, round, and reactive to light. No scleral icterus.  Neck: Normal range of motion. Neck supple.  Cardiovascular: Normal rate, regular rhythm, normal heart sounds and intact distal pulses.   No murmur heard. Pulses:      Radial pulses are 2+ on the right side, and 2+ on the left side.       Dorsalis pedis pulses are 2+ on the right side, and 2+ on the left side.  Pulmonary/Chest: Effort normal and breath sounds normal. No respiratory distress. He has no wheezes. He has no rales.  Musculoskeletal: He exhibits no edema.       Diabetic foot exam: Normal inspection No skin breakdown No calluses  Normal DP/PT pulses Normal sensation to light touch and monofilament Nails normal  Lymphadenopathy:    He has no cervical adenopathy.  Skin: Skin is warm and dry. No rash noted.  Psychiatric: He has a normal mood and affect.       Assessment & Plan:

## 2011-10-16 NOTE — Assessment & Plan Note (Signed)
Advised I don't think he needs more abx currently. F/u with dentist as scheduled. Update [place] if worsening pain or any drainage.

## 2011-10-16 NOTE — Assessment & Plan Note (Signed)
Stable from this standpoint, no evidence of recurrence per last OV this month with ENT.  Has f/u with onc later this month.

## 2011-10-16 NOTE — Assessment & Plan Note (Signed)
Last a1c at goal.  Discussed possibility of adding metformin. Will monitor for now. rec schedule vision screen

## 2011-10-30 ENCOUNTER — Other Ambulatory Visit (INDEPENDENT_AMBULATORY_CARE_PROVIDER_SITE_OTHER): Payer: 59

## 2011-10-30 DIAGNOSIS — E119 Type 2 diabetes mellitus without complications: Secondary | ICD-10-CM

## 2011-10-30 DIAGNOSIS — I10 Essential (primary) hypertension: Secondary | ICD-10-CM

## 2011-10-30 LAB — HEMOGLOBIN A1C: Hgb A1c MFr Bld: 7.4 % — ABNORMAL HIGH (ref 4.6–6.5)

## 2011-10-30 LAB — CREATININE, SERUM: Creatinine, Ser: 1 mg/dL (ref 0.4–1.5)

## 2011-11-01 ENCOUNTER — Other Ambulatory Visit: Payer: Self-pay | Admitting: Family Medicine

## 2011-11-01 MED ORDER — METFORMIN HCL 500 MG PO TABS: 500.0000 mg | ORAL_TABLET | Freq: Every day | ORAL | Status: DC

## 2012-01-16 ENCOUNTER — Ambulatory Visit (INDEPENDENT_AMBULATORY_CARE_PROVIDER_SITE_OTHER): Payer: 59 | Admitting: Family Medicine

## 2012-01-16 ENCOUNTER — Encounter: Payer: Self-pay | Admitting: Family Medicine

## 2012-01-16 VITALS — BP 116/80 | HR 96 | Temp 98.7°F | Wt 176.8 lb

## 2012-01-16 DIAGNOSIS — E781 Pure hyperglyceridemia: Secondary | ICD-10-CM

## 2012-01-16 DIAGNOSIS — E039 Hypothyroidism, unspecified: Secondary | ICD-10-CM

## 2012-01-16 DIAGNOSIS — E038 Other specified hypothyroidism: Secondary | ICD-10-CM | POA: Insufficient documentation

## 2012-01-16 DIAGNOSIS — I10 Essential (primary) hypertension: Secondary | ICD-10-CM

## 2012-01-16 DIAGNOSIS — E119 Type 2 diabetes mellitus without complications: Secondary | ICD-10-CM

## 2012-01-16 DIAGNOSIS — Z23 Encounter for immunization: Secondary | ICD-10-CM

## 2012-01-16 MED ORDER — METFORMIN HCL 500 MG PO TABS: 500.0000 mg | ORAL_TABLET | Freq: Every day | ORAL | Status: DC

## 2012-01-16 NOTE — Assessment & Plan Note (Signed)
Check FLP today.  Pt fasting. Off simvastatin/zocor.

## 2012-01-16 NOTE — Progress Notes (Signed)
  Subjective:    Patient ID: Mitchell Davis, male    DOB: 1951/03/19, 61 y.o.   MRN: [id number]  HPI CC: 3 mo f/u  Pleasant 61 yo with recurrent squamous cell carcinoma of tongue/tonsil. Saw Dr. Lendell Caprice (ENT) 10/2011, rec f/u 6 mo, no evidence of recurrence.  DM - sugars running 90s-100 fasting in am.  Checks bid - mornings and evening.  Tolerating metformin ok.  Last vision screen - none recent, fixing to get plugged in through government program  Pneumonia shot - done today.  No low sugars. Lab Results  Component Value Date   HGBA1C 7.4* 10/30/2011   HTN - significant improvement with addition of lisinopril.  No HA, vision changes, CP/tightness, SOB, leg swelling.  BP Readings from Last 3 Encounters:  01/16/12 116/80  10/16/11 150/92  07/17/11 124/78    To follow up with onc 04/2012.  Saw Dr. Lendell Caprice yesterday.  Immunizations - due for tetanus, pneumonia, shingles.  Will give Tdap and pneumovax today, asked him to check with zostavax.  Review of Systems Per HPI    Objective:   Physical Exam  Nursing note and vitals reviewed. Constitutional: He appears well-developed and well-nourished. No distress.  HENT:  Head: Normocephalic and atraumatic.  Right Ear: External ear normal.  Left Ear: External ear normal.  Nose: Nose normal.  Mouth/Throat: Oropharynx is clear and moist. No oropharyngeal exudate.  Eyes: Conjunctivae and EOM are normal. Pupils are equal, round, and reactive to light. No scleral icterus.  Neck: Normal range of motion. Neck supple.       Hardened soft tissue at neck Diminished carotid pulses on left compared to right  Cardiovascular: Normal rate, regular rhythm, normal heart sounds and intact distal pulses.   No murmur heard. Pulmonary/Chest: Effort normal and breath sounds normal. No respiratory distress. He has no wheezes. He has no rales.  Musculoskeletal: He exhibits no edema.       Diabetic foot exam: Normal inspection No skin breakdown No calluses    Normal DP/PT pulses Normal sensation to light touch and monofilament Nails normal  Lymphadenopathy:    He has no cervical adenopathy.  Skin: Skin is warm and dry. No rash noted.  Psychiatric: He has a normal mood and affect.       Assessment & Plan:

## 2012-01-16 NOTE — Assessment & Plan Note (Signed)
Subclinical hypothyroid - Dr. Chrystine Oiler (onc) started him on low dose synthroid. States radiation went through thyroid.  Recheck today.

## 2012-01-16 NOTE — Assessment & Plan Note (Signed)
Chronic, stable. Last A1c slightly elevated so started on metformin.  Tolerating well. Check A1c today as well as urine microalb. rtc 6 mo for f/u. Lab Results  Component Value Date   HGBA1C 7.4* 10/30/2011

## 2012-01-16 NOTE — Progress Notes (Signed)
Addended by: Josph Macho A on: 01/16/2012 03:35 PM   Modules accepted: Orders

## 2012-01-16 NOTE — Assessment & Plan Note (Signed)
Chronic, stable. Last visit started ACEI, tolerating well.  Continue regimen.

## 2012-01-16 NOTE — Patient Instructions (Addendum)
Pneumonia and tetanus shots today (Tdap) Check with oncologist about shingles shot, if she says it's ok, call your insurace about the shingles shot to see if it is covered or how much it would cost and where is cheaper (here or pharmacy).  If you want to receive here, call for nurse visit.  Blood work today Return in 6 months for physical.

## 2012-01-17 ENCOUNTER — Other Ambulatory Visit: Payer: Self-pay | Admitting: Family Medicine

## 2012-01-17 LAB — BASIC METABOLIC PANEL
CO2: 29 mEq/L (ref 19–32)
Chloride: 104 mEq/L (ref 96–112)
Creatinine, Ser: 1.2 mg/dL (ref 0.4–1.5)
Sodium: 141 mEq/L (ref 135–145)

## 2012-01-17 LAB — MICROALBUMIN / CREATININE URINE RATIO
Creatinine,U: 206.2 mg/dL
Microalb Creat Ratio: 0.8 mg/g (ref 0.0–30.0)
Microalb, Ur: 1.6 mg/dL (ref 0.0–1.9)

## 2012-01-17 LAB — LIPID PANEL
HDL: 36.2 mg/dL — ABNORMAL LOW (ref >39.00)
Triglycerides: 274 mg/dL — ABNORMAL HIGH (ref 0.0–149.0)

## 2012-01-17 LAB — TSH: TSH: 4.42 u[IU]/mL (ref 0.35–5.50)

## 2012-01-17 LAB — HEMOGLOBIN A1C: Hgb A1c MFr Bld: 7.5 % — ABNORMAL HIGH (ref 4.6–6.5)

## 2012-01-17 LAB — LDL CHOLESTEROL, DIRECT: Direct LDL: 94.7 mg/dL

## 2012-01-17 MED ORDER — METFORMIN HCL 500 MG PO TABS: 500.0000 mg | ORAL_TABLET | Freq: Two times a day (BID) | ORAL | Status: DC

## 2012-01-17 MED ORDER — SIMVASTATIN 20 MG PO TABS: 20.0000 mg | ORAL_TABLET | Freq: Every day | ORAL | Status: DC

## 2012-07-07 ENCOUNTER — Other Ambulatory Visit: Payer: Self-pay | Admitting: Family Medicine

## 2012-07-07 DIAGNOSIS — C099 Malignant neoplasm of tonsil, unspecified: Secondary | ICD-10-CM

## 2012-07-07 DIAGNOSIS — Z125 Encounter for screening for malignant neoplasm of prostate: Secondary | ICD-10-CM

## 2012-07-07 DIAGNOSIS — I10 Essential (primary) hypertension: Secondary | ICD-10-CM

## 2012-07-07 DIAGNOSIS — E039 Hypothyroidism, unspecified: Secondary | ICD-10-CM

## 2012-07-07 DIAGNOSIS — E781 Pure hyperglyceridemia: Secondary | ICD-10-CM

## 2012-07-07 DIAGNOSIS — E119 Type 2 diabetes mellitus without complications: Secondary | ICD-10-CM

## 2012-07-09 ENCOUNTER — Other Ambulatory Visit: Payer: Self-pay

## 2012-07-09 MED ORDER — SIMVASTATIN 20 MG PO TABS: 20.0000 mg | ORAL_TABLET | Freq: Every day | ORAL | Status: DC

## 2012-07-09 NOTE — Telephone Encounter (Signed)
Pt out of simvastatin; pt has CPX 07/18/12.pt said gets 90 day same cost as 30 day. Pt notified filled # 90 until CPX.

## 2012-07-15 ENCOUNTER — Other Ambulatory Visit (INDEPENDENT_AMBULATORY_CARE_PROVIDER_SITE_OTHER): Payer: 59

## 2012-07-15 DIAGNOSIS — E039 Hypothyroidism, unspecified: Secondary | ICD-10-CM

## 2012-07-15 DIAGNOSIS — Z125 Encounter for screening for malignant neoplasm of prostate: Secondary | ICD-10-CM

## 2012-07-15 DIAGNOSIS — E781 Pure hyperglyceridemia: Secondary | ICD-10-CM

## 2012-07-15 DIAGNOSIS — E038 Other specified hypothyroidism: Secondary | ICD-10-CM

## 2012-07-15 DIAGNOSIS — E119 Type 2 diabetes mellitus without complications: Secondary | ICD-10-CM

## 2012-07-15 LAB — LIPID PANEL
HDL: 32.4 mg/dL — ABNORMAL LOW (ref >39.00)
Total CHOL/HDL Ratio: 4
VLDL: 42.6 mg/dL — ABNORMAL HIGH (ref 0.0–40.0)

## 2012-07-15 LAB — HEMOGLOBIN A1C: Hgb A1c MFr Bld: 8.7 % — ABNORMAL HIGH (ref 4.6–6.5)

## 2012-07-15 LAB — COMPREHENSIVE METABOLIC PANEL
Albumin: 4.3 g/dL (ref 3.5–5.2)
CO2: 26 mEq/L (ref 19–32)
GFR: 52.44 mL/min — ABNORMAL LOW (ref >60.00)
Glucose, Bld: 182 mg/dL — ABNORMAL HIGH (ref 70–99)
Total Protein: 6.8 g/dL (ref 6.0–8.3)

## 2012-07-15 LAB — PSA: PSA: 0.98 ng/mL (ref 0.10–4.00)

## 2012-07-15 LAB — TSH: TSH: 4.18 u[IU]/mL (ref 0.35–5.50)

## 2012-07-16 LAB — LDL CHOLESTEROL, DIRECT: Direct LDL: 75 mg/dL

## 2012-07-18 ENCOUNTER — Encounter: Payer: Self-pay | Admitting: Family Medicine

## 2012-07-18 ENCOUNTER — Ambulatory Visit (INDEPENDENT_AMBULATORY_CARE_PROVIDER_SITE_OTHER): Payer: 59 | Admitting: Family Medicine

## 2012-07-18 VITALS — BP 126/78 | HR 96 | Temp 98.8°F | Ht 70.75 in | Wt 184.8 lb

## 2012-07-18 DIAGNOSIS — H9190 Unspecified hearing loss, unspecified ear: Secondary | ICD-10-CM

## 2012-07-18 DIAGNOSIS — Z136 Encounter for screening for cardiovascular disorders: Secondary | ICD-10-CM

## 2012-07-18 DIAGNOSIS — Z1211 Encounter for screening for malignant neoplasm of colon: Secondary | ICD-10-CM

## 2012-07-18 DIAGNOSIS — Z0000 Encounter for general adult medical examination without abnormal findings: Secondary | ICD-10-CM

## 2012-07-18 DIAGNOSIS — Z139 Encounter for screening, unspecified: Secondary | ICD-10-CM

## 2012-07-18 DIAGNOSIS — I10 Essential (primary) hypertension: Secondary | ICD-10-CM

## 2012-07-18 DIAGNOSIS — Z23 Encounter for immunization: Secondary | ICD-10-CM

## 2012-07-18 DIAGNOSIS — E119 Type 2 diabetes mellitus without complications: Secondary | ICD-10-CM

## 2012-07-18 DIAGNOSIS — Z8719 Personal history of other diseases of the digestive system: Secondary | ICD-10-CM

## 2012-07-18 MED ORDER — INFLUENZA VIRUS VACC SPLIT PF IM SUSP: 0.5000 mL | Freq: Once | INTRAMUSCULAR | Status: DC

## 2012-07-18 NOTE — Assessment & Plan Note (Signed)
No records of this, pt thinks done around 2008 or 2009, told normal.  Does have fmhx colon cancer.  Consider referring for colonoscopy next year.  iFOB today.

## 2012-07-18 NOTE — Assessment & Plan Note (Addendum)
I have personally reviewed the Medicare Annual Wellness questionnaire and have noted 1. The patient's medical and social history 2. Their use of alcohol, tobacco or illicit drugs 3. Their current medications and supplements 4. The patient's functional ability including ADL's, fall risks, home safety risks and hearing or visual impairment. 5. Diet and physical activity 6. Evidence for depression or mood disorders The patients weight, height, BMI have been recorded in the chart.  Hearing and vision has been addressed. I have made referrals, counseling and provided education to the patient based review of the above and I have provided the pt with a written personalized care plan for preventive services. See scanned questionairre. Advanced directives discussed: pt will look into packet of information on advance directives  EKG today - NSR rate 100, mild R axis, normal intervals, no ST/T changes.  asxs from cardiac standpoint. Schedule AAA screening US.  Flu shot today.  Pt will check into shingles. Reviewed preventative protocols and updated unless pt declined.

## 2012-07-18 NOTE — Assessment & Plan Note (Signed)
Chronic, stable. Continue med. 

## 2012-07-18 NOTE — Assessment & Plan Note (Signed)
Chronic, deteriorated. Had been taking metformin only qd.  rec start bid. Cr bumped last visit - recheck next visit ,if remains elevated will d/c metformin.

## 2012-07-18 NOTE — Progress Notes (Addendum)
Subjective:    Patient ID: Mitchell Davis, male    DOB: [birth year], 61 y.o.   MRN: [id number]  HPI CC: welcome to medicare visit  Medicare is primary insurance.  DM - had only been taking metformin 500mg  once daily instead of scheduled twice daily.  Due to follow up with dentist.  Has not returned to see dentist recently.  Wt Readings from Last 3 Encounters:  07/18/12 184 lb 12 oz (83.802 kg)  01/16/12 176 lb 12 oz (80.173 kg)  10/16/11 181 lb 12 oz (82.441 kg)    Depression - denies..  No anhedonia, enjoys hunting and fishing.  Prior on celexa, but stopped taking several months ago.  Only takes prn. No falls in last year.  Has not seen eye doctor- could not afford to pay for lens replacement on glasses.  Passes vision screen today. 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  Has not seen audiologist.  interested in this but worried about hearing aide cost.  Preventative: Tdap and pneumonia shot 2013. Flu shot - got sick once after flu shot.  Hesitant but agrees to receive today. Shingles shot - will check with insurance. EKG today. Will send for screening AAA given smoking hx. Colonoscopy - per pt done 2008-2009, recommended rpt 10 yrs.  However pt has fmhx colon cancer.  Unsure who did last colonoscopy. Prostate screening - requests yearly screening.  Advanced directives - requests info to read up on.  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  Single.  2 kids Activity:  Diet: good water and milk, fruits/vegetables daily  Medications and allergies reviewed and updated in chart.  Past histories reviewed and updated if relevant as below. Patient Active Problem List  Diagnosis  . NEOP, MALIGNANT, TONSIL  . DIABETES MELLITUS, TYPE II  . HYPERTRIGLYCERIDEMIA  . Anxiety and depression  . History of tobacco use  . HYPERTENSION  . COLONOSCOPY, HX OF  . Constipation  . Poor dentition  . Subclinical  hypothyroidism  . Medicare welcome visit   Past Medical History  Diagnosis Date  . Diabetes mellitus type II 1996    uncontrolled  . HTN (hypertension)   . Hypertriglyceridemia   . Anxiety   . 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  first right then left.  s/p surgery; XRT; Chemo  . History of chicken pox   . Depression   . Seasonal allergies    Past Surgical History  Procedure Date  . Tonsillectomy 2008    R sided tonsil cancer   History  Substance Use Topics  . Smoking status: Former Smoker    Quit date: 09/14/2010  . Smokeless tobacco: Never Used  . Alcohol Use: No   Family History  Problem Relation Age of Onset  . Colon cancer Father     1st degree relative <60  . Coronary artery disease Mother   . Stroke Mother 60  . Heart disease Mother   . Asthma Brother   . Diabetes Brother   . Stroke Brother    Allergies  Allergen Reactions  . Amoxicillin-Pot Clavulanate Rash   Current Outpatient Prescriptions on File Prior to Visit  Medication Sig Dispense Refill  . levothyroxine (SYNTHROID, LEVOTHROID) 50 MCG tablet Take 50 mcg by mouth daily.       Marland Kitchen lisinopril (PRINIVIL,ZESTRIL) 5 MG tablet Take 1 tablet (5 mg total) by mouth daily.  90 tablet  3  .  Magnesium 250 MG TABS Take 1 tablet by mouth daily.        . metFORMIN (GLUCOPHAGE) 500 MG tablet Take 1 tablet (500 mg total) by mouth 2 (two) times daily with a meal.  180 tablet  3  . oxycodone (OXY-IR) 5 MG capsule Take 5 mg by mouth every 4 (four) hours as needed.        . simvastatin (ZOCOR) 20 MG tablet Take 1 tablet (20 mg total) by mouth at bedtime.  90 tablet  0  . folic acid (GNP FOLIC ACID) 400 MCG tablet Take 400 mcg by mouth every other day.       Marland Kitchen glucose blood test strip Freestyle Flash Lite 250.00  100 each  11  . Lancets MISC Freestyle Flash Lite, use as directed  100 each  11      Review of Systems  Constitutional: Positive for unexpected weight change (gain).  Negative for fever, chills, activity change, appetite change and fatigue.  HENT: Negative for hearing loss and neck pain.   Eyes: Negative for visual disturbance.  Respiratory: Positive for shortness of breath (intermittent, rare). Negative for cough, chest tightness and wheezing.   Cardiovascular: Negative for chest pain, palpitations and leg swelling.  Gastrointestinal: Negative for nausea, vomiting, abdominal pain, diarrhea, constipation, blood in stool and abdominal distention.  Genitourinary: Negative for hematuria and difficulty urinating.  Musculoskeletal: Negative for myalgias and arthralgias.  Skin: Negative for rash.  Neurological: Positive for dizziness. Negative for seizures, syncope and headaches.  Hematological: Does not bruise/bleed easily.  Psychiatric/Behavioral: Negative for dysphoric mood. The patient is not nervous/anxious.        Objective:   Physical Exam  Nursing note and vitals reviewed. Constitutional: He is oriented to person, place, and time. He appears well-developed and well-nourished. No distress.  HENT:  Head: Normocephalic and atraumatic.  Right Ear: Tympanic membrane, external ear and ear canal normal. Decreased hearing is noted.  Left Ear: Tympanic membrane, external ear and ear canal normal. Decreased hearing is noted.  Nose: Nose normal.  Mouth/Throat: Uvula is midline and mucous membranes are normal. Posterior oropharyngeal erythema present. No oropharyngeal exudate, posterior oropharyngeal edema or tonsillar abscesses.       L hearing loss Post oropharyngeal irritation  Eyes: Conjunctivae normal and EOM are normal. Pupils are equal, round, and reactive to light. No scleral icterus.  Neck: Normal range of motion. Neck supple. No thyromegaly present.       Rigid neck  Cardiovascular: Normal rate, regular rhythm, normal heart sounds and intact distal pulses.   No murmur heard. Pulses:      Radial pulses are 2+ on the right side, and 2+ on the left  side.  Pulmonary/Chest: Effort normal and breath sounds normal. No respiratory distress. He has no wheezes. He has no rales.  Abdominal: Soft. Bowel sounds are normal. He exhibits no distension and no mass. There is no tenderness. There is no rebound and no guarding.       No abd bruit  Genitourinary: Prostate normal. Rectal exam shows external hemorrhoid (R noninflamed). Rectal exam shows no internal hemorrhoid, no fissure, no mass, no tenderness and anal tone normal. Prostate is not enlarged (20gm) and not tender.  Musculoskeletal: Normal range of motion. He exhibits no edema.  Lymphadenopathy:    He has no cervical adenopathy.  Neurological: He is alert and oriented to person, place, and time.       CN grossly intact, station and gait intact  Skin: Skin is  warm and dry. No rash noted.  Psychiatric: He has a normal mood and affect. His behavior is normal. Judgment and thought content normal.       Assessment & Plan:

## 2012-07-18 NOTE — Patient Instructions (Addendum)
Schedule to see dentist. EKG today. Flu shot today. Stool kit today. Pass by Marion's office for referral for aortic aneurysm and possible audiology referral Call your insurance about the shingles shot to see if it is covered or how much it would cost and where is cheaper (here or pharmacy).  If you want to receive here, call for nurse visit. Advanced directive packet provided.

## 2012-07-25 ENCOUNTER — Other Ambulatory Visit: Payer: Self-pay | Admitting: Family Medicine

## 2012-07-25 MED ORDER — LISINOPRIL 5 MG PO TABS: 5.0000 mg | ORAL_TABLET | Freq: Every day | ORAL | Status: DC

## 2012-07-25 MED ORDER — METFORMIN HCL 500 MG PO TABS: 500.0000 mg | ORAL_TABLET | Freq: Two times a day (BID) | ORAL | Status: DC

## 2012-07-25 MED ORDER — SIMVASTATIN 20 MG PO TABS: 20.0000 mg | ORAL_TABLET | Freq: Every day | ORAL | Status: DC

## 2012-07-25 NOTE — Assessment & Plan Note (Signed)
will refer to audiology for eval of hearing loss and see if would benefit from hearing aides.  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

## 2012-07-30 ENCOUNTER — Other Ambulatory Visit: Payer: 59

## 2012-07-30 DIAGNOSIS — Z1211 Encounter for screening for malignant neoplasm of colon: Secondary | ICD-10-CM

## 2012-07-30 LAB — FECAL OCCULT BLOOD, IMMUNOCHEMICAL: Fecal Occult Bld: NEGATIVE

## 2012-07-31 ENCOUNTER — Encounter: Payer: Self-pay | Admitting: *Deleted

## 2012-07-31 ENCOUNTER — Telehealth: Payer: Self-pay | Admitting: *Deleted

## 2012-08-01 ENCOUNTER — Encounter (INDEPENDENT_AMBULATORY_CARE_PROVIDER_SITE_OTHER): Payer: 59

## 2012-08-01 DIAGNOSIS — Z0000 Encounter for general adult medical examination without abnormal findings: Secondary | ICD-10-CM

## 2012-08-01 DIAGNOSIS — Z136 Encounter for screening for cardiovascular disorders: Secondary | ICD-10-CM

## 2012-08-05 ENCOUNTER — Encounter: Payer: Self-pay | Admitting: *Deleted

## 2012-08-06 ENCOUNTER — Encounter: Payer: Self-pay | Admitting: Family Medicine

## 2012-08-14 HISTORY — PX: MYRINGOTOMY: SUR874

## 2012-09-04 NOTE — Telephone Encounter (Signed)
Opened in error

## 2012-09-14 DIAGNOSIS — R1313 Dysphagia, pharyngeal phase: Secondary | ICD-10-CM

## 2012-09-14 HISTORY — DX: Dysphagia, pharyngeal phase: R13.13

## 2012-09-24 ENCOUNTER — Other Ambulatory Visit: Payer: Self-pay

## 2012-09-24 MED ORDER — SIMVASTATIN 20 MG PO TABS: 20.0000 mg | ORAL_TABLET | Freq: Every day | ORAL | Status: DC

## 2012-09-24 NOTE — Telephone Encounter (Signed)
Pt left v/m requesting refill simvastatin to [facility] [address]. Pt notified done.

## 2012-10-09 ENCOUNTER — Encounter: Payer: Self-pay | Admitting: Family Medicine

## 2012-10-17 ENCOUNTER — Encounter: Payer: Self-pay | Admitting: Family Medicine

## 2012-10-17 ENCOUNTER — Ambulatory Visit (INDEPENDENT_AMBULATORY_CARE_PROVIDER_SITE_OTHER): Payer: 59 | Admitting: Family Medicine

## 2012-10-17 VITALS — BP 124/88 | HR 96 | Temp 98.1°F | Wt 188.5 lb

## 2012-10-17 DIAGNOSIS — E781 Pure hyperglyceridemia: Secondary | ICD-10-CM

## 2012-10-17 DIAGNOSIS — C099 Malignant neoplasm of tonsil, unspecified: Secondary | ICD-10-CM

## 2012-10-17 DIAGNOSIS — I10 Essential (primary) hypertension: Secondary | ICD-10-CM

## 2012-10-17 DIAGNOSIS — E119 Type 2 diabetes mellitus without complications: Secondary | ICD-10-CM

## 2012-10-17 MED ORDER — BLOOD GLUCOSE METER KIT: PACK | Status: DC

## 2012-10-17 MED ORDER — GLUCOSE BLOOD VI STRP: ORAL_STRIP | Status: DC

## 2012-10-17 MED ORDER — SIMVASTATIN 20 MG PO TABS: 20.0000 mg | ORAL_TABLET | Freq: Every day | ORAL | Status: DC

## 2012-10-17 NOTE — Assessment & Plan Note (Signed)
Chronic, stable. Continue med. 

## 2012-10-17 NOTE — Assessment & Plan Note (Addendum)
Uncontrolled as evidenced by latest A1c. Has increased metformin to bid.   Will need close monitoring of Cr - check today as well as rpt A1c. rec schedule eye exam. 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 Lab Results  Component Value Date   HGBA1C 8.7* 07/15/2012

## 2012-10-17 NOTE — Assessment & Plan Note (Signed)
Back on simvastatin - will need check at next fasting blood work - may need increased potency statin.

## 2012-10-17 NOTE — Patient Instructions (Addendum)
I've sent in 3 month supply of simvastatin. I've sent in glucose meter and test strips for you today.  Start checking blood sugars intermittently. Schedule eye exam. Foot exam today. Blood work today.  We will check your kidneys and sugar.  Watch diet.

## 2012-10-17 NOTE — Assessment & Plan Note (Signed)
Stable as of last PET scan. Oxycodone prn.

## 2012-10-17 NOTE — Progress Notes (Signed)
  Subjective:    Patient ID: Mitchell Davis, male    DOB: [birth year], 62 y.o.   MRN: [id number]  HPI CC: 3 mo f/u  DM - compliant with metformin 500mg  bid.  Requests meter and test strips.  Has not been checking sugars 2/2 no strip or meter.  Some paresthesias in feet.  Denies hypoglycemic sxs.  Due to schedule eye exam. Lab Results  Component Value Date   HGBA1C 8.7* 07/15/2012   HTN - No vision changes, CP/tightness, SOB, leg swelling.  Compliant with lisinopril.  HA this morning, resolved.  HLD - chronic ,stable.  no muscle aches.  Squamous cell cancer of throat/tongue. No evidence of recurrence. recent swallow evaluation - stable.  To set up with local speech therapy - still has not been called to set this up.  Hearing loss - saw Dr. Jenne Campus dx with L serous otitis.  Rec f/u in 3 wks, pt decided against ear tubes - medicare would not pay for this.  Had ear cleaning - significant improvement noted.  Past Medical History  Diagnosis Date  . Diabetes mellitus type II 1996    uncontrolled  . HTN (hypertension)   . Hypertriglyceridemia   . Anxiety   . 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  first right then left.  s/p surgery; XRT; Chemo  . History of chicken pox   . Depression   . Seasonal allergies   . Hearing loss     R severe sensorineural, L severe mixed hearing loss  . Pharyngeal dysphagia 09/2012    rec outpt dysphagia therapy and rpt FEES 3 mo    Review of Systems Per HPI    Objective:   Physical Exam  Nursing note and vitals reviewed. Constitutional: He appears well-developed and well-nourished. No distress.  HENT:  Head: Normocephalic and atraumatic.  Right Ear: Tympanic membrane, external ear and ear canal normal. Decreased hearing is noted.  Left Ear: Tympanic membrane, external ear and ear canal normal. Decreased hearing is noted.  Nose: Nose normal.  Mouth/Throat: Oropharynx is clear and moist. No oropharyngeal exudate.   Eyes: Conjunctivae and EOM are normal. Pupils are equal, round, and reactive to light. No scleral icterus.  Neck: Normal range of motion. Neck supple. Carotid bruit is not present.  Woody hard consistency  Cardiovascular: Normal rate, regular rhythm, normal heart sounds and intact distal pulses.   No murmur heard. Pulmonary/Chest: Effort normal and breath sounds normal. No respiratory distress. He has no wheezes. He has no rales.  Musculoskeletal: He exhibits no edema.  Diabetic foot exam: Normal inspection No skin breakdown No calluses  Normal DP/PT pulses Normal sensation to light touch and slightly diminished to monofilament on right Nails normal  Lymphadenopathy:    He has no cervical adenopathy.  Skin: Skin is warm and dry. No rash noted.  Psychiatric: He has a normal mood and affect.       Assessment & Plan:

## 2012-10-21 ENCOUNTER — Other Ambulatory Visit: Payer: Self-pay | Admitting: Family Medicine

## 2012-10-21 ENCOUNTER — Other Ambulatory Visit: Payer: Self-pay | Admitting: *Deleted

## 2012-10-21 ENCOUNTER — Other Ambulatory Visit (INDEPENDENT_AMBULATORY_CARE_PROVIDER_SITE_OTHER): Payer: 59

## 2012-10-21 DIAGNOSIS — E119 Type 2 diabetes mellitus without complications: Secondary | ICD-10-CM

## 2012-10-21 DIAGNOSIS — I10 Essential (primary) hypertension: Secondary | ICD-10-CM

## 2012-10-21 LAB — BASIC METABOLIC PANEL
CO2: 26 mEq/L (ref 19–32)
Chloride: 101 mEq/L (ref 96–112)
Creatinine, Ser: 1.4 mg/dL (ref 0.4–1.5)
Potassium: 4.7 mEq/L (ref 3.5–5.1)

## 2012-10-21 LAB — HEMOGLOBIN A1C: Hgb A1c MFr Bld: 8.8 % — ABNORMAL HIGH (ref 4.6–6.5)

## 2012-10-21 MED ORDER — GLUCOSE BLOOD VI STRP: ORAL_STRIP | Status: DC

## 2012-10-21 MED ORDER — GLIMEPIRIDE 1 MG PO TABS: 0.5000 mg | ORAL_TABLET | Freq: Every day | ORAL | Status: DC

## 2012-10-21 MED ORDER — FREESTYLE SYSTEM KIT: 1.0000 | PACK | Freq: Every day | Status: AC | PRN

## 2012-10-21 NOTE — Addendum Note (Signed)
Addended by: Alvina Chou on: 10/21/2012 09:12 AM   Modules accepted: Orders

## 2012-10-25 ENCOUNTER — Other Ambulatory Visit: Payer: Self-pay | Admitting: *Deleted

## 2012-10-25 MED ORDER — GLUCOSE BLOOD VI STRP: ORAL_STRIP | Status: AC

## 2012-11-01 ENCOUNTER — Telehealth: Payer: Self-pay | Admitting: *Deleted

## 2012-11-01 DIAGNOSIS — E038 Other specified hypothyroidism: Secondary | ICD-10-CM

## 2012-11-01 DIAGNOSIS — E039 Hypothyroidism, unspecified: Secondary | ICD-10-CM

## 2012-11-01 NOTE — Telephone Encounter (Signed)
Ok to switch - but notify pt of switch and need to come in in 6 wk for thyroid check labwork.  Placed order in chart.

## 2012-11-01 NOTE — Telephone Encounter (Signed)
Pharmacy requests to switch Levothyroxine from Mylan to Sandoz. Ok to do?

## 2012-11-05 MED ORDER — LEVOTHYROXINE SODIUM 50 MCG PO TABS: 50.0000 ug | ORAL_TABLET | Freq: Every day | ORAL | Status: DC

## 2012-11-05 NOTE — Telephone Encounter (Signed)
Pharmacy and patient notified. Lab appt scheduled. 

## 2012-11-12 DIAGNOSIS — H6990 Unspecified Eustachian tube disorder, unspecified ear: Secondary | ICD-10-CM

## 2012-11-12 DIAGNOSIS — H6980 Other specified disorders of Eustachian tube, unspecified ear: Secondary | ICD-10-CM

## 2012-11-12 HISTORY — DX: Other specified disorders of Eustachian tube, unspecified ear: H69.80

## 2012-11-12 HISTORY — DX: Unspecified eustachian tube disorder, unspecified ear: H69.90

## 2012-11-18 ENCOUNTER — Encounter (HOSPITAL_BASED_OUTPATIENT_CLINIC_OR_DEPARTMENT_OTHER): Payer: Medicare Other | Attending: Plastic Surgery

## 2012-11-18 ENCOUNTER — Other Ambulatory Visit (HOSPITAL_COMMUNITY): Payer: Self-pay | Admitting: Plastic Surgery

## 2012-11-18 ENCOUNTER — Ambulatory Visit (HOSPITAL_COMMUNITY)
Admission: RE | Admit: 2012-11-18 | Discharge: 2012-11-18 | Disposition: A | Discharge status: Home / Self Care | Payer: 59 | Source: Ambulatory Visit | Attending: Plastic Surgery | Admitting: Plastic Surgery

## 2012-11-18 DIAGNOSIS — L98499 Non-pressure chronic ulcer of skin of other sites with unspecified severity: Secondary | ICD-10-CM

## 2012-11-18 DIAGNOSIS — I10 Essential (primary) hypertension: Secondary | ICD-10-CM | POA: Insufficient documentation

## 2012-11-18 DIAGNOSIS — Y842 Radiological procedure and radiotherapy as the cause of abnormal reaction of the patient, or of later complication, without mention of misadventure at the time of the procedure: Secondary | ICD-10-CM | POA: Insufficient documentation

## 2012-11-18 DIAGNOSIS — Z87891 Personal history of nicotine dependence: Secondary | ICD-10-CM | POA: Insufficient documentation

## 2012-11-18 DIAGNOSIS — M278 Other specified diseases of jaws: Secondary | ICD-10-CM | POA: Insufficient documentation

## 2012-11-18 DIAGNOSIS — C029 Malignant neoplasm of tongue, unspecified: Secondary | ICD-10-CM | POA: Insufficient documentation

## 2012-11-18 DIAGNOSIS — F17200 Nicotine dependence, unspecified, uncomplicated: Secondary | ICD-10-CM | POA: Insufficient documentation

## 2012-11-18 DIAGNOSIS — Z79899 Other long term (current) drug therapy: Secondary | ICD-10-CM | POA: Insufficient documentation

## 2012-11-18 DIAGNOSIS — Z7982 Long term (current) use of aspirin: Secondary | ICD-10-CM | POA: Insufficient documentation

## 2012-11-18 DIAGNOSIS — E119 Type 2 diabetes mellitus without complications: Secondary | ICD-10-CM | POA: Insufficient documentation

## 2012-11-18 DIAGNOSIS — Z85819 Personal history of malignant neoplasm of unspecified site of lip, oral cavity, and pharynx: Secondary | ICD-10-CM | POA: Insufficient documentation

## 2012-11-19 NOTE — Progress Notes (Signed)
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  He has exposure of the lateral left mandible with bone exposure and some redness around the area.  SOCIAL HISTORY:  He quit smoking over 2 years ago.  He used dip in the past, but none recently.  He was a 2-pack per day smoker for 45 years. He occasionally has alcohol and drank heavily in the past, but not for the past 15 years.  MEDICATIONS:  Metformin, aspirin, Synthroid, antihypertensive, and oxycodone.  ALLERGIES:  Amoxicillin.  PAST MEDICAL HISTORY:  Diabetes, thyroid disease, hypertension.  REVIEW OF SYSTEMS:  As stated above with pain in the jaw area.  PHYSICAL EXAMINATION:  GENERAL:  He is alert, oriented, and cooperative. He is not in any acute distress.  He is pleasant. EYES:  His pupils are equal.  Extraocular muscles are intact. NECK:  He has some firm tight skin of the cervical area secondary to the radiation. LUNGS:  He does not have any difficulty breathing. HEART:  His heart rate is regular. ABDOMEN:  Soft. EXTREMITIES:  Lower extremity pulses and upper extremity pulses are strong and regular.  He does  not have any breakdown on the outer skin area of his neck or jaw, but he does have bone exposed on the inner table with exposure of the left mandible.  This is showing evidence of necrosis secondary to the radiation exposure with late effect radiation damage.  Strongly recommend hyperbaric oxygen treatment for improving his wound healing capacity.  Also he has been asked to be referred to a local or maxillofacial surgeon as well and I will send for the records to find out how many RADS he received during the radiation treatment.  The ulcer is 2 x 1 cm.  Strict oral hygiene is recommended with multivitamin, vitamin C, and zinc, and high-protein diet, and we will see him back in followup.     [name], DO     CS/MEDQ  D:  11/18/2012  T:  11/19/2012  Job:  [id number]

## 2012-11-26 ENCOUNTER — Encounter: Payer: Self-pay | Admitting: Family Medicine

## 2012-11-26 LAB — GLUCOSE, CAPILLARY: Glucose-Capillary: 99 mg/dL (ref 70–99)

## 2012-11-27 LAB — GLUCOSE, CAPILLARY
Glucose-Capillary: 292 mg/dL — ABNORMAL HIGH (ref 70–99)
Glucose-Capillary: 206 mg/dL — ABNORMAL HIGH (ref 70–99)

## 2012-11-28 LAB — GLUCOSE, CAPILLARY
Glucose-Capillary: 253 mg/dL — ABNORMAL HIGH (ref 70–99)
Glucose-Capillary: 168 mg/dL — ABNORMAL HIGH (ref 70–99)

## 2012-12-02 LAB — GLUCOSE, CAPILLARY: Glucose-Capillary: 285 mg/dL — ABNORMAL HIGH (ref 70–99)

## 2012-12-05 LAB — GLUCOSE, CAPILLARY
Glucose-Capillary: 272 mg/dL — ABNORMAL HIGH (ref 70–99)
Glucose-Capillary: 206 mg/dL — ABNORMAL HIGH (ref 70–99)

## 2012-12-06 LAB — GLUCOSE, CAPILLARY
Glucose-Capillary: 167 mg/dL — ABNORMAL HIGH (ref 70–99)
Glucose-Capillary: 129 mg/dL — ABNORMAL HIGH (ref 70–99)

## 2012-12-09 LAB — GLUCOSE, CAPILLARY: Glucose-Capillary: 227 mg/dL — ABNORMAL HIGH (ref 70–99)

## 2012-12-12 ENCOUNTER — Encounter (HOSPITAL_BASED_OUTPATIENT_CLINIC_OR_DEPARTMENT_OTHER): Payer: Medicare Other | Attending: Plastic Surgery

## 2012-12-12 DIAGNOSIS — M278 Other specified diseases of jaws: Secondary | ICD-10-CM | POA: Insufficient documentation

## 2012-12-12 DIAGNOSIS — Y842 Radiological procedure and radiotherapy as the cause of abnormal reaction of the patient, or of later complication, without mention of misadventure at the time of the procedure: Secondary | ICD-10-CM | POA: Insufficient documentation

## 2012-12-12 DIAGNOSIS — E119 Type 2 diabetes mellitus without complications: Secondary | ICD-10-CM | POA: Insufficient documentation

## 2012-12-12 DIAGNOSIS — Z79899 Other long term (current) drug therapy: Secondary | ICD-10-CM | POA: Insufficient documentation

## 2012-12-12 DIAGNOSIS — I10 Essential (primary) hypertension: Secondary | ICD-10-CM | POA: Insufficient documentation

## 2012-12-13 LAB — GLUCOSE, CAPILLARY
Glucose-Capillary: 142 mg/dL — ABNORMAL HIGH (ref 70–99)
Glucose-Capillary: 97 mg/dL (ref 70–99)

## 2012-12-16 LAB — GLUCOSE, CAPILLARY: Glucose-Capillary: 115 mg/dL — ABNORMAL HIGH (ref 70–99)

## 2012-12-17 LAB — GLUCOSE, CAPILLARY: Glucose-Capillary: 261 mg/dL — ABNORMAL HIGH (ref 70–99)

## 2012-12-18 ENCOUNTER — Other Ambulatory Visit (INDEPENDENT_AMBULATORY_CARE_PROVIDER_SITE_OTHER): Payer: Medicare Other

## 2012-12-18 ENCOUNTER — Other Ambulatory Visit: Payer: Medicare Other

## 2012-12-18 DIAGNOSIS — E038 Other specified hypothyroidism: Secondary | ICD-10-CM

## 2012-12-18 DIAGNOSIS — E039 Hypothyroidism, unspecified: Secondary | ICD-10-CM

## 2012-12-19 ENCOUNTER — Encounter: Payer: Self-pay | Admitting: *Deleted

## 2012-12-20 LAB — GLUCOSE, CAPILLARY
Glucose-Capillary: 119 mg/dL — ABNORMAL HIGH (ref 70–99)
Glucose-Capillary: 193 mg/dL — ABNORMAL HIGH (ref 70–99)
Glucose-Capillary: 160 mg/dL — ABNORMAL HIGH (ref 70–99)

## 2012-12-23 LAB — GLUCOSE, CAPILLARY
Glucose-Capillary: 163 mg/dL — ABNORMAL HIGH (ref 70–99)
Glucose-Capillary: 131 mg/dL — ABNORMAL HIGH (ref 70–99)

## 2012-12-25 LAB — GLUCOSE, CAPILLARY: Glucose-Capillary: 151 mg/dL — ABNORMAL HIGH (ref 70–99)

## 2012-12-30 LAB — GLUCOSE, CAPILLARY: Glucose-Capillary: 122 mg/dL — ABNORMAL HIGH (ref 70–99)

## 2013-01-02 LAB — GLUCOSE, CAPILLARY: Glucose-Capillary: 291 mg/dL — ABNORMAL HIGH (ref 70–99)

## 2013-01-03 LAB — GLUCOSE, CAPILLARY: Glucose-Capillary: 143 mg/dL — ABNORMAL HIGH (ref 70–99)

## 2013-01-06 ENCOUNTER — Encounter: Payer: Self-pay | Admitting: Family Medicine

## 2013-01-07 LAB — GLUCOSE, CAPILLARY: Glucose-Capillary: 280 mg/dL — ABNORMAL HIGH (ref 70–99)

## 2013-01-08 LAB — GLUCOSE, CAPILLARY
Glucose-Capillary: 170 mg/dL — ABNORMAL HIGH (ref 70–99)
Glucose-Capillary: 120 mg/dL — ABNORMAL HIGH (ref 70–99)

## 2013-01-13 ENCOUNTER — Encounter (HOSPITAL_BASED_OUTPATIENT_CLINIC_OR_DEPARTMENT_OTHER): Payer: Medicare Other | Attending: Plastic Surgery

## 2013-01-13 DIAGNOSIS — Y842 Radiological procedure and radiotherapy as the cause of abnormal reaction of the patient, or of later complication, without mention of misadventure at the time of the procedure: Secondary | ICD-10-CM | POA: Insufficient documentation

## 2013-01-13 DIAGNOSIS — M278 Other specified diseases of jaws: Secondary | ICD-10-CM | POA: Insufficient documentation

## 2013-01-13 DIAGNOSIS — E119 Type 2 diabetes mellitus without complications: Secondary | ICD-10-CM | POA: Insufficient documentation

## 2013-01-14 LAB — GLUCOSE, CAPILLARY
Glucose-Capillary: 126 mg/dL — ABNORMAL HIGH (ref 70–99)
Glucose-Capillary: 105 mg/dL — ABNORMAL HIGH (ref 70–99)
Glucose-Capillary: 212 mg/dL — ABNORMAL HIGH (ref 70–99)

## 2013-01-15 LAB — GLUCOSE, CAPILLARY
Glucose-Capillary: 218 mg/dL — ABNORMAL HIGH (ref 70–99)
Glucose-Capillary: 130 mg/dL — ABNORMAL HIGH (ref 70–99)

## 2013-01-20 LAB — GLUCOSE, CAPILLARY: Glucose-Capillary: 154 mg/dL — ABNORMAL HIGH (ref 70–99)

## 2013-01-21 LAB — GLUCOSE, CAPILLARY
Glucose-Capillary: 188 mg/dL — ABNORMAL HIGH (ref 70–99)
Glucose-Capillary: 186 mg/dL — ABNORMAL HIGH (ref 70–99)

## 2013-01-22 LAB — GLUCOSE, CAPILLARY
Glucose-Capillary: 228 mg/dL — ABNORMAL HIGH (ref 70–99)
Glucose-Capillary: 121 mg/dL — ABNORMAL HIGH (ref 70–99)

## 2013-01-24 LAB — GLUCOSE, CAPILLARY: Glucose-Capillary: 164 mg/dL — ABNORMAL HIGH (ref 70–99)

## 2013-01-27 LAB — GLUCOSE, CAPILLARY
Glucose-Capillary: 257 mg/dL — ABNORMAL HIGH (ref 70–99)
Glucose-Capillary: 211 mg/dL — ABNORMAL HIGH (ref 70–99)

## 2013-01-28 LAB — GLUCOSE, CAPILLARY: Glucose-Capillary: 255 mg/dL — ABNORMAL HIGH (ref 70–99)

## 2013-01-30 LAB — GLUCOSE, CAPILLARY: Glucose-Capillary: 256 mg/dL — ABNORMAL HIGH (ref 70–99)

## 2013-01-31 LAB — GLUCOSE, CAPILLARY: Glucose-Capillary: 171 mg/dL — ABNORMAL HIGH (ref 70–99)

## 2013-02-03 LAB — GLUCOSE, CAPILLARY: Glucose-Capillary: 164 mg/dL — ABNORMAL HIGH (ref 70–99)

## 2013-02-04 LAB — GLUCOSE, CAPILLARY
Glucose-Capillary: 285 mg/dL — ABNORMAL HIGH (ref 70–99)
Glucose-Capillary: 208 mg/dL — ABNORMAL HIGH (ref 70–99)

## 2013-02-05 LAB — GLUCOSE, CAPILLARY: Glucose-Capillary: 158 mg/dL — ABNORMAL HIGH (ref 70–99)

## 2013-02-11 ENCOUNTER — Encounter (HOSPITAL_BASED_OUTPATIENT_CLINIC_OR_DEPARTMENT_OTHER): Payer: 59

## 2013-02-17 ENCOUNTER — Ambulatory Visit (INDEPENDENT_AMBULATORY_CARE_PROVIDER_SITE_OTHER): Payer: Medicare Other | Admitting: Family Medicine

## 2013-02-17 ENCOUNTER — Encounter: Payer: Self-pay | Admitting: Family Medicine

## 2013-02-17 VITALS — BP 100/60 | HR 76 | Temp 98.6°F | Wt 183.8 lb

## 2013-02-17 DIAGNOSIS — E1165 Type 2 diabetes mellitus with hyperglycemia: Secondary | ICD-10-CM

## 2013-02-17 DIAGNOSIS — I10 Essential (primary) hypertension: Secondary | ICD-10-CM

## 2013-02-17 NOTE — Patient Instructions (Addendum)
No changes today - will draw blood work and call you with results and any changes to medicines. Good to see you today. If improved sugar, we will have you return in 6 months for follow up

## 2013-02-17 NOTE — Assessment & Plan Note (Signed)
Chronic, uncontrolled.  Recent increase in amaryl - recheck A1c today. Anticipate improved control.  rec schedule eye exam.   F/u pending based on blood work results. Lab Results  Component Value Date   HGBA1C 8.8* 10/21/2012

## 2013-02-17 NOTE — Assessment & Plan Note (Signed)
Chronic, stable. Continue lisinopril 5mg daily. 

## 2013-02-17 NOTE — Progress Notes (Signed)
  Subjective:    Patient ID: Mitchell Davis, male    DOB: 03-05-1951, 62 y.o.   MRN: [id number]  HPI CC: 3 mo DM f/u   DM - checks sugars fasting 90-100.  Usually checks fasting in morning and at night time.  Compliant with metformin 500mg  bid and amaryl 0.5mg  daily.  No low sugars.  Rare high sugar >200.  Pneumovax 2013.  No recent eye exam - planning in next few months.  Foot exam 10/2012.  Denies paresthesias. Lab Results  Component Value Date   HGBA1C 8.8* 10/21/2012     HTN - compliant with lisinopril.    On hyperbaric treatment s/p radiation damage to neck.  Completed last treatment Thurs.  Wt Readings from Last 3 Encounters:  02/17/13 183 lb 12 oz (83.348 kg)  10/17/12 188 lb 8 oz (85.503 kg)  07/18/12 184 lb 12 oz (83.802 kg)    Past Medical History  Diagnosis Date  . Diabetes mellitus type II 1996    uncontrolled  . HTN (hypertension)   . Hypertriglyceridemia   . Anxiety   . 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  first right then left.  s/p surgery; XRT; Chemo  . History of chicken pox   . Depression   . Seasonal allergies   . Hearing loss     R severe sensorineural, L severe mixed hearing loss  . Pharyngeal dysphagia 09/2012    rec outpt dysphagia therapy and rpt FEES 3 mo  . ETD (eustachian tube dysfunction) 11/2012    improved s/p myringotomy tubes and hyperbaric chamber for osteonecrosis of mandible    Review of Systems Per HPI    Objective:   Physical Exam  Nursing note and vitals reviewed. Constitutional: He appears well-developed and well-nourished. No distress.  HENT:  Head: Normocephalic and atraumatic.  Mouth/Throat: Oropharynx is clear and moist. No oropharyngeal exudate.  Eyes: Conjunctivae and EOM are normal. Pupils are equal, round, and reactive to light. No scleral icterus.  Neck:  Woody consistency of neck  Cardiovascular: Normal rate, regular rhythm, normal heart sounds and intact distal pulses.   No  murmur heard. Pulmonary/Chest: Effort normal and breath sounds normal. No respiratory distress. He has no wheezes. He has no rales.  Musculoskeletal: He exhibits no edema.  Skin: Skin is warm and dry. No rash noted.       Assessment & Plan:

## 2013-02-18 LAB — BASIC METABOLIC PANEL
CO2: 28 mEq/L (ref 19–32)
Calcium: 8.8 mg/dL (ref 8.4–10.5)
Creatinine, Ser: 1.5 mg/dL (ref 0.4–1.5)
GFR: 51.12 mL/min — ABNORMAL LOW (ref >60.00)
Glucose, Bld: 91 mg/dL (ref 70–99)
Sodium: 135 mEq/L (ref 135–145)

## 2013-02-19 ENCOUNTER — Encounter: Payer: Self-pay | Admitting: *Deleted

## 2013-02-20 ENCOUNTER — Other Ambulatory Visit: Payer: Self-pay

## 2013-09-17 ENCOUNTER — Other Ambulatory Visit: Payer: Self-pay | Admitting: Family Medicine

## 2013-10-12 DIAGNOSIS — I639 Cerebral infarction, unspecified: Secondary | ICD-10-CM

## 2013-10-12 HISTORY — DX: Cerebral infarction, unspecified: I63.9

## 2013-10-27 ENCOUNTER — Other Ambulatory Visit: Payer: Self-pay | Admitting: Family Medicine

## 2013-11-12 DIAGNOSIS — Z931 Gastrostomy status: Secondary | ICD-10-CM

## 2013-11-12 HISTORY — DX: Gastrostomy status: Z93.1

## 2014-02-23 ENCOUNTER — Telehealth: Payer: Self-pay | Admitting: *Deleted

## 2014-02-23 NOTE — Telephone Encounter (Signed)
Lm on pts vm requesting a call back to schedule DIABETIC BUNDLE OV-needs LDL

## 2014-04-17 IMAGING — CR DG CHEST 2V
2 series · 2 of 2 positions shown · non-contrast
Comparison: None.

CLINICAL DATA: Recurrent squamous cell carcinoma of the tongue.

CHEST - 2 VIEW

[w chest pa]
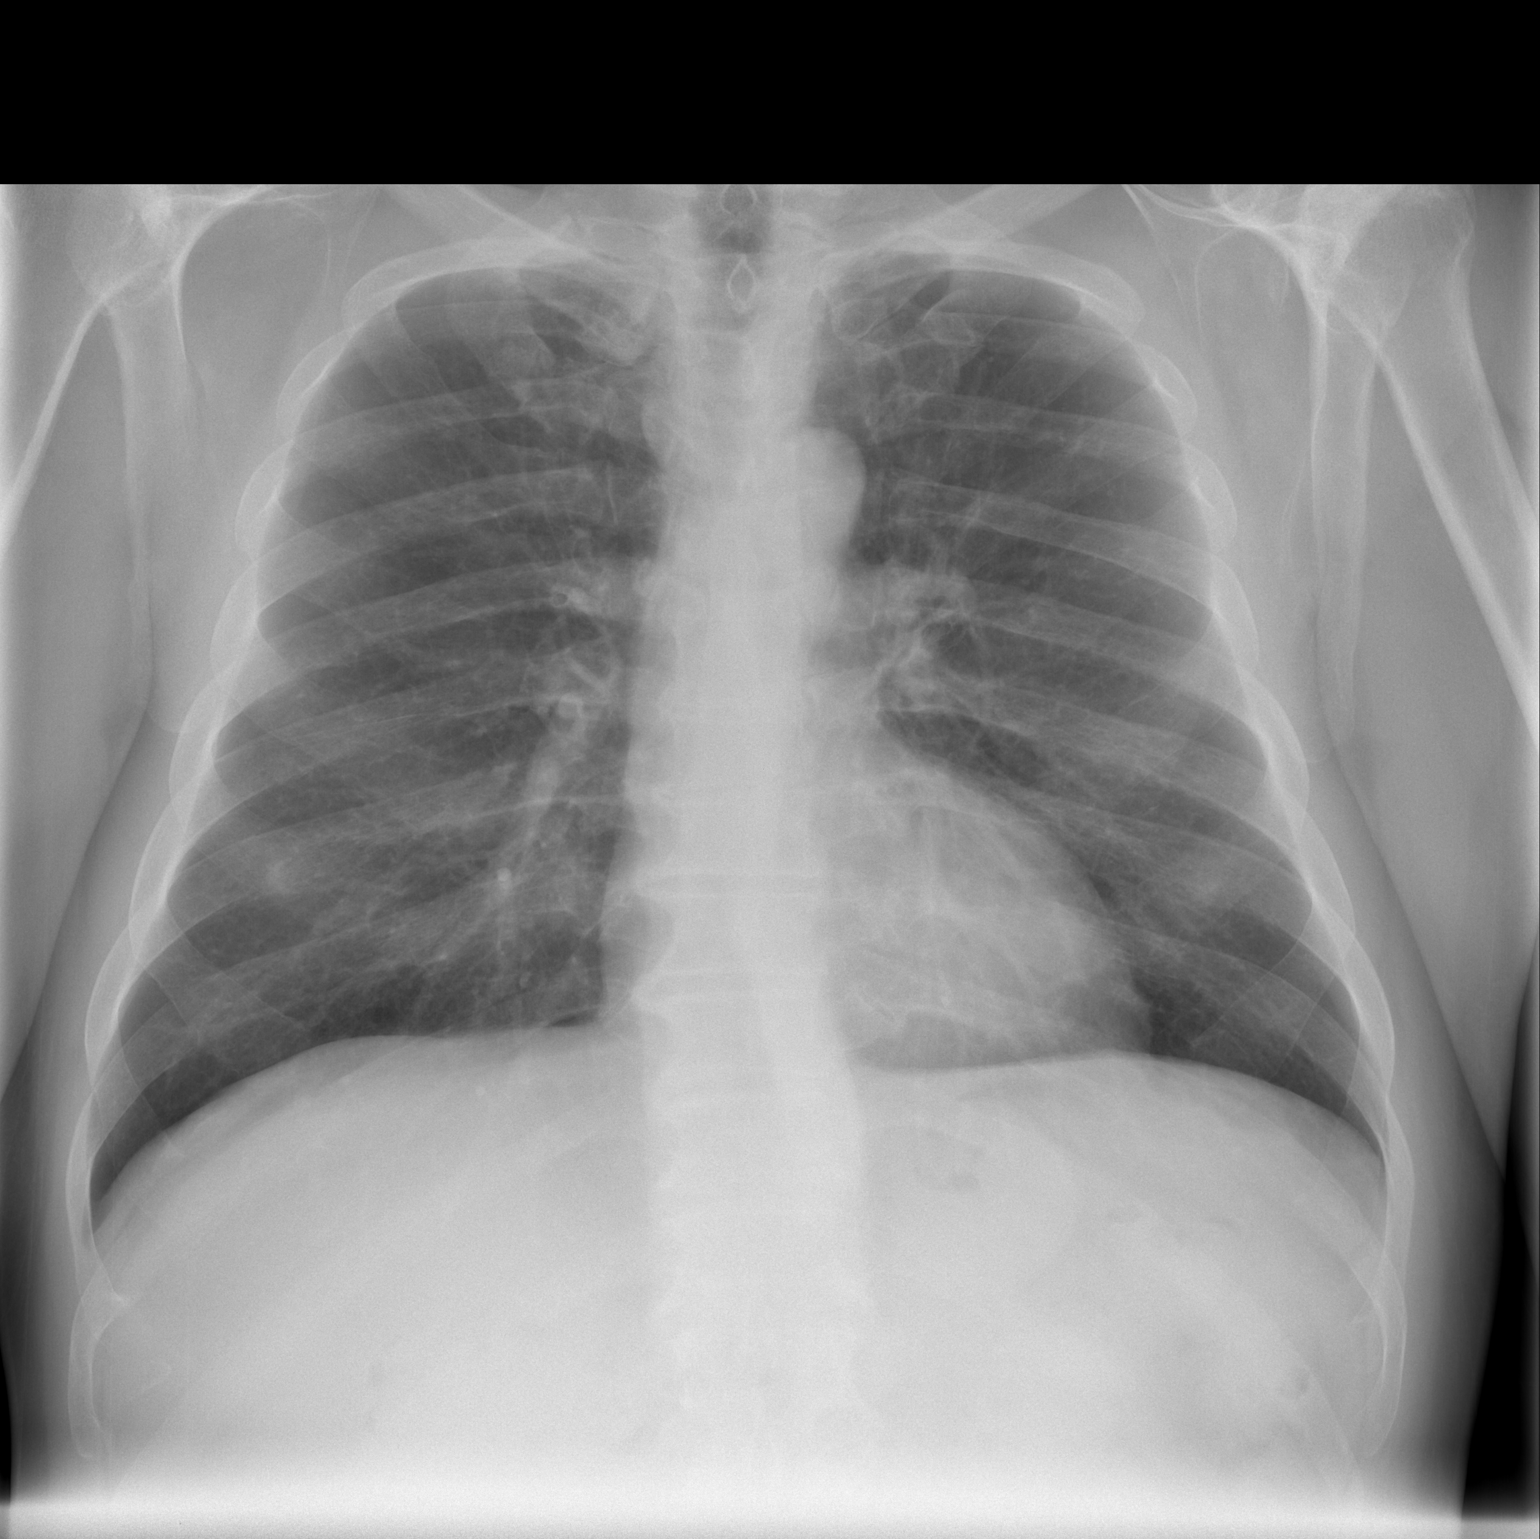

[w chest lat]
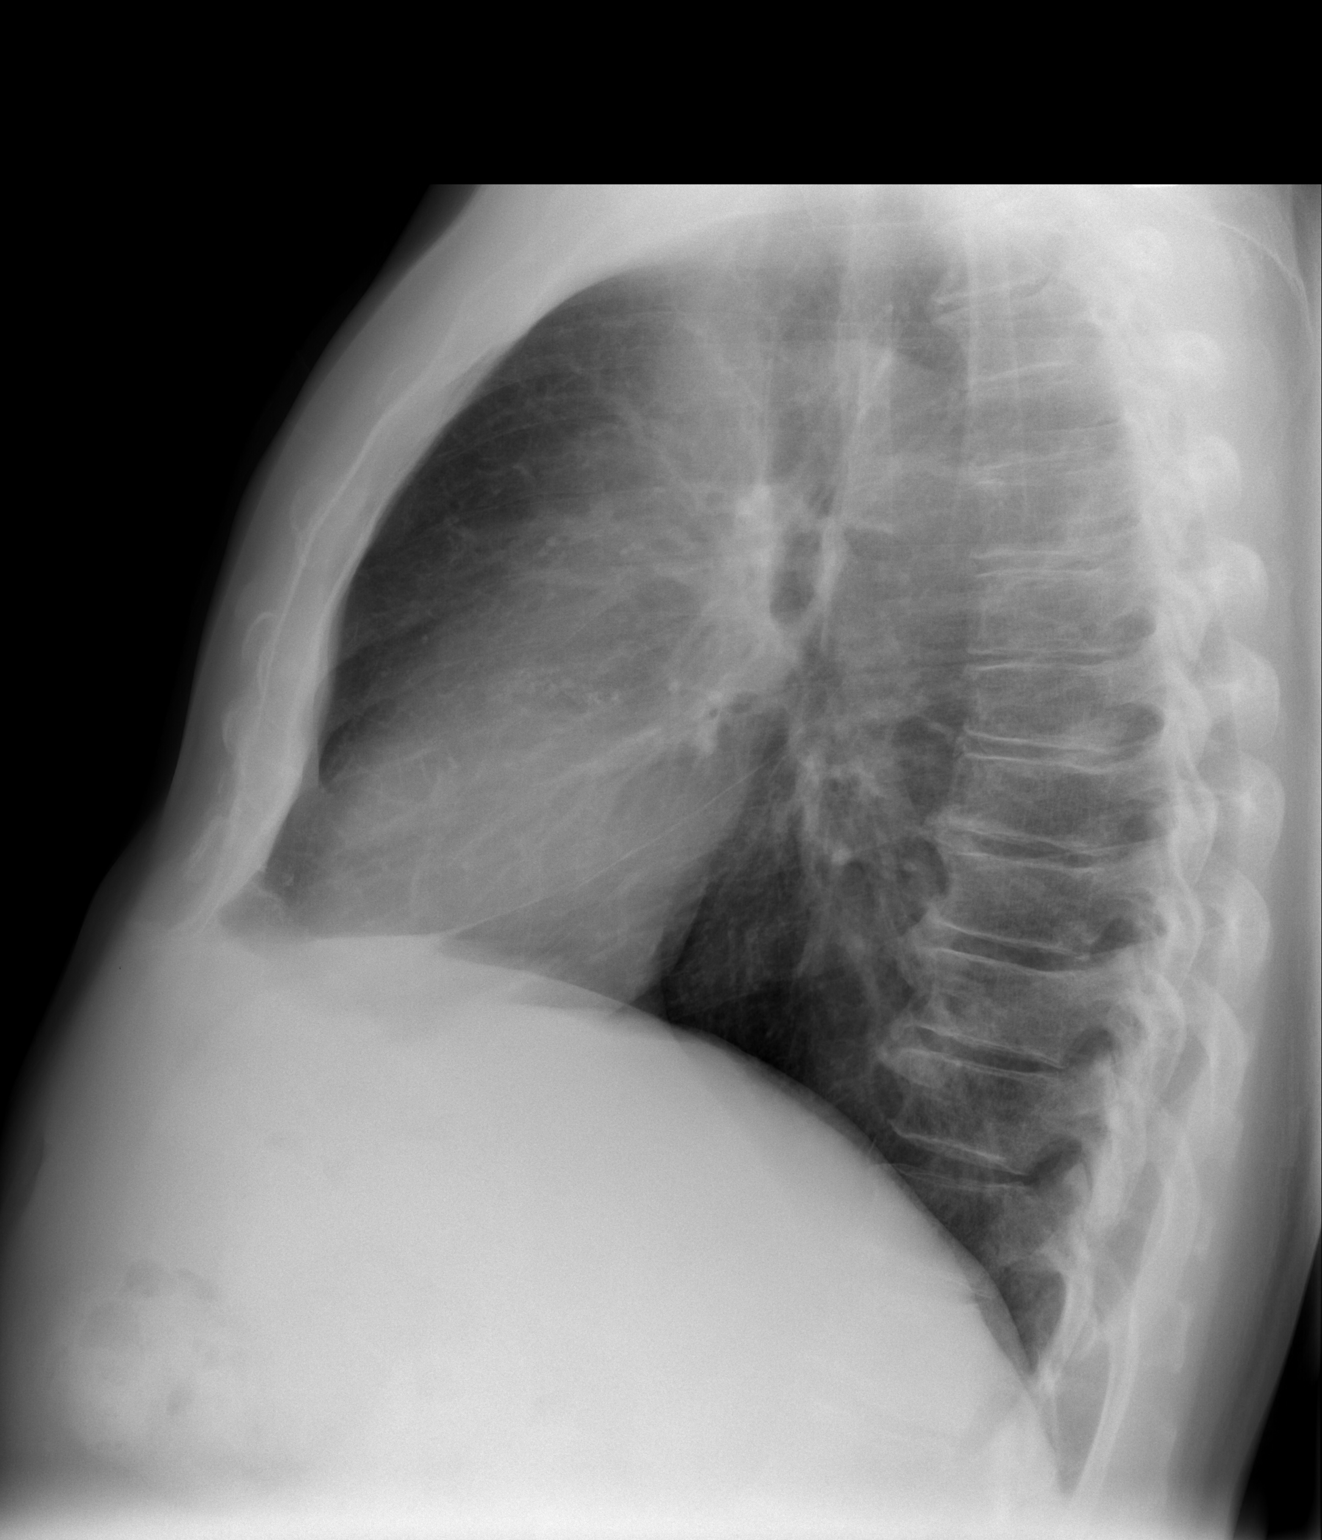

[2 of 2 positions shown; findings below may reference images not displayed]

FINDINGS: The heart size and pulmonary vascularity are normal and
the lungs are clear.  The patient has osteophytes in the mid to
lower thoracic spine particularly prominent at T7-8 at the right
costovertebral joint.

 No acute osseous abnormality.
IMPRESSION: No acute disease.

## 2014-09-14 DIAGNOSIS — K148 Other diseases of tongue: Secondary | ICD-10-CM

## 2014-09-14 HISTORY — DX: Other diseases of tongue: K14.8

## 2014-10-13 ENCOUNTER — Telehealth: Payer: Self-pay | Admitting: *Deleted

## 2014-10-13 NOTE — Telephone Encounter (Signed)
Yvette from Hospice called in reference to patient. He has been at [facility] and is being discharged today with in-home hospice services. She wanted to know if you would be his attending with them overseeing or if you wanted them to take over care. Records from [place] in your IN box for review. No d/c summary available-only H&P.

## 2014-10-13 NOTE — Telephone Encounter (Signed)
Mitchell Davis notified and should hopefully have d/c summary this afternoon. She will fax it when she receives it.

## 2014-10-13 NOTE — Telephone Encounter (Signed)
Yes I will be attending.  Can we try for D/C summary at end of week?

## 2014-10-13 DEATH — deceased

## 2014-10-14 ENCOUNTER — Telehealth: Payer: Self-pay | Admitting: Family Medicine

## 2014-10-14 ENCOUNTER — Encounter: Payer: Self-pay | Admitting: Family Medicine

## 2014-10-14 DIAGNOSIS — E039 Hypothyroidism, unspecified: Secondary | ICD-10-CM | POA: Insufficient documentation

## 2014-10-14 DIAGNOSIS — J449 Chronic obstructive pulmonary disease, unspecified: Secondary | ICD-10-CM | POA: Insufficient documentation

## 2014-10-14 DIAGNOSIS — Z931 Gastrostomy status: Secondary | ICD-10-CM | POA: Insufficient documentation

## 2014-10-14 DIAGNOSIS — Z87891 Personal history of nicotine dependence: Secondary | ICD-10-CM | POA: Insufficient documentation

## 2014-10-15 NOTE — Telephone Encounter (Signed)
Filled and in Kim's box. 

## 2014-10-15 NOTE — Telephone Encounter (Signed)
Funeral home notified and certificate placed up front for pick up. Copy given to [facility].

## 2014-11-13 NOTE — Telephone Encounter (Signed)
In your IN box for signature.

## 2014-11-13 NOTE — Telephone Encounter (Signed)
Branch dropped off death certificate for pt that needs to be signed.  Placing on Kim's desk. Thanks.

## 2014-11-13 DEATH — deceased
